# Patient Record
Sex: Male | Born: 1990 | Race: White | Hispanic: No | Marital: Single | Smoking: Never smoker
Health system: Southern US, Community
[De-identification: ages and names within clinical notes are randomized; demographics above are authoritative.]

## PROBLEM LIST (undated history)

## (undated) DIAGNOSIS — G43909 Migraine, unspecified, not intractable, without status migrainosus: Secondary | ICD-10-CM

## (undated) HISTORY — PX: APPENDECTOMY: SHX54

---
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
## 2012-08-27 ENCOUNTER — Encounter (HOSPITAL_COMMUNITY): Payer: Self-pay | Admitting: Emergency Medicine

## 2012-08-27 ENCOUNTER — Emergency Department (HOSPITAL_COMMUNITY)
Admission: EM | Admit: 2012-08-27 | Discharge: 2012-08-27 | Disposition: A | Discharge status: Home / Self Care | Payer: BC Managed Care – PPO | Attending: Emergency Medicine | Admitting: Emergency Medicine

## 2012-08-27 DIAGNOSIS — J029 Acute pharyngitis, unspecified: Secondary | ICD-10-CM | POA: Insufficient documentation

## 2012-08-27 MED ORDER — PENICILLIN V POTASSIUM 250 MG PO TABS
500.0000 mg | ORAL_TABLET | Freq: Once | ORAL | Status: AC
  Administered 2012-08-27: 500 mg via ORAL
  Filled 2012-08-27: qty 2

## 2012-08-27 MED ORDER — PENICILLIN V POTASSIUM 500 MG PO TABS: 500.0000 mg | ORAL_TABLET | Freq: Four times a day (QID) | ORAL | Status: AC

## 2012-08-27 NOTE — ED Notes (Signed)
Pt c/o sore throat since yesterday 

## 2012-08-27 NOTE — ED Provider Notes (Signed)
History     CSN: [id number]  Arrival date & time 08/27/12  1019   First MD Initiated Contact with Patient 08/27/12 1043      Chief Complaint  Patient presents with  . Sore Throat    (Consider location/radiation/quality/duration/timing/severity/associated sxs/prior treatment) HPI Comments: Sore throat for several days.  No other complaints   Patient is a 21 y.o. male presenting with pharyngitis. The history is provided by the patient. No language interpreter was used.  Sore Throat This is a new problem. Episode onset: several days ago. The problem occurs constantly. The problem has been unchanged. Associated symptoms include myalgias and a sore throat. Pertinent negatives include no chills, coughing, fever, headaches, nausea, rash or vomiting. The symptoms are aggravated by swallowing. Treatments tried: chloraseptic. The treatment provided no relief.    History reviewed. No pertinent past medical history.  Past Surgical History  Procedure Date  . Appendectomy     No family history on file.  History  Substance Use Topics  . Smoking status: Not on file  . Smokeless tobacco: Not on file  . Alcohol Use: No      Review of Systems  Constitutional: Negative for fever and chills.  HENT: Positive for sore throat. Negative for drooling and trouble swallowing.   Respiratory: Negative for cough.   Gastrointestinal: Negative for nausea, vomiting and diarrhea.  Musculoskeletal: Positive for myalgias.  Skin: Negative for rash.  Neurological: Negative for headaches.  All other systems reviewed and are negative.    Allergies  Review of patient's allergies indicates no known allergies.  Home Medications   Current Outpatient Rx  Name Route Sig Dispense Refill  . PHENOL 1.4 % MT LIQD Mouth/Throat Use as directed 1 spray in the mouth or throat as needed. For sore throat    . PENICILLIN V POTASSIUM 500 MG PO TABS Oral Take 1 tablet (500 mg total) by mouth 4 (four) times daily.  40 tablet 0    BP 143/66  Pulse 90  Temp 99.4 F (37.4 C) (Oral)  Resp 18  Ht 6\' 2"  (1.88 m)  Wt 220 lb (99.791 kg)  BMI 28.25 kg/m2  SpO2 98%  Physical Exam  Nursing note and vitals reviewed. Constitutional: He is oriented to person, place, and time. He appears well-developed and well-nourished.  HENT:  Head: Normocephalic and atraumatic.  Mouth/Throat: Uvula is midline and mucous membranes are normal. Normal dentition. No dental abscesses, uvula swelling or dental caries. Oropharyngeal exudate and posterior oropharyngeal erythema present. No posterior oropharyngeal edema or tonsillar abscesses.  Eyes: EOM are normal.  Neck: Normal range of motion.  Cardiovascular: Normal rate, regular rhythm, normal heart sounds and intact distal pulses.   Pulmonary/Chest: Effort normal and breath sounds normal. No respiratory distress.  Abdominal: Soft. He exhibits no distension. There is no tenderness.  Musculoskeletal: Normal range of motion.  Lymphadenopathy:    He has cervical adenopathy.       Right cervical: Superficial cervical and deep cervical adenopathy present. No posterior cervical adenopathy present.      Left cervical: Superficial cervical and deep cervical adenopathy present. No posterior cervical adenopathy present.  Neurological: He is alert and oriented to person, place, and time.  Skin: Skin is warm and dry.  Psychiatric: He has a normal mood and affect. Judgment normal.    ED Course  Procedures (including critical care time)   Labs Reviewed  RAPID STREP SCREEN   No results found.   1. Pharyngitis  MDM  empiric tx of strep rx-pen VK 500 mg QID x 10 days. F/u with PCP          Evalina Field, PA 08/27/12 1124

## 2012-08-27 NOTE — ED Provider Notes (Signed)
Medical screening examination/treatment/procedure(s) were performed by non-physician practitioner and as supervising physician I was immediately available for consultation/collaboration.   Dione Booze, MD 08/27/12 [phone]

## 2013-05-07 ENCOUNTER — Encounter (HOSPITAL_COMMUNITY): Payer: Self-pay | Admitting: *Deleted

## 2013-05-07 ENCOUNTER — Emergency Department (HOSPITAL_COMMUNITY)
Admission: EM | Admit: 2013-05-07 | Discharge: 2013-05-07 | Disposition: A | Discharge status: Home / Self Care | Payer: Self-pay | Attending: Emergency Medicine | Admitting: Emergency Medicine

## 2013-05-07 ENCOUNTER — Emergency Department (HOSPITAL_COMMUNITY): Payer: Self-pay

## 2013-05-07 DIAGNOSIS — F40243 Fear of flying: Secondary | ICD-10-CM

## 2013-05-07 DIAGNOSIS — F4000 Agoraphobia, unspecified: Secondary | ICD-10-CM | POA: Insufficient documentation

## 2013-05-07 DIAGNOSIS — R51 Headache: Secondary | ICD-10-CM | POA: Insufficient documentation

## 2013-05-07 DIAGNOSIS — H53149 Visual discomfort, unspecified: Secondary | ICD-10-CM | POA: Insufficient documentation

## 2013-05-07 MED ORDER — HYDROCODONE-ACETAMINOPHEN 5-325 MG PO TABS: 1.0000 | ORAL_TABLET | ORAL | Status: DC | PRN

## 2013-05-07 MED ORDER — PREDNISONE 50 MG PO TABS
60.0000 mg | ORAL_TABLET | Freq: Once | ORAL | Status: AC
  Administered 2013-05-07: 60 mg via ORAL
  Filled 2013-05-07: qty 1

## 2013-05-07 MED ORDER — HYDROCODONE-ACETAMINOPHEN 5-325 MG PO TABS
1.0000 | ORAL_TABLET | Freq: Once | ORAL | Status: AC
  Administered 2013-05-07: 1 via ORAL
  Filled 2013-05-07: qty 1

## 2013-05-07 MED ORDER — ALPRAZOLAM 0.5 MG PO TABS: 0.5000 mg | ORAL_TABLET | Freq: Every evening | ORAL | Status: DC | PRN

## 2013-05-07 MED ORDER — DIPHENHYDRAMINE HCL 25 MG PO CAPS
50.0000 mg | ORAL_CAPSULE | Freq: Once | ORAL | Status: AC
  Administered 2013-05-07: 50 mg via ORAL
  Filled 2013-05-07: qty 2

## 2013-05-07 MED ORDER — METOCLOPRAMIDE HCL 10 MG PO TABS
10.0000 mg | ORAL_TABLET | Freq: Once | ORAL | Status: AC
  Administered 2013-05-07: 10 mg via ORAL
  Filled 2013-05-07: qty 1

## 2013-05-07 NOTE — ED Notes (Signed)
Patient with no complaints at this time. Respirations even and unlabored. Skin warm/dry. Discharge instructions reviewed with patient at this time. Patient given opportunity to voice concerns/ask questions. Patient discharged at this time and left Emergency Department with steady gait.   

## 2013-05-07 NOTE — ED Notes (Signed)
After discharging patient and cleaning room, Reglan and Prednisone tablets were found in bed from earlier administration.  At discharge patient was asking if provider would consider changing Vicodin rx to Percocet "since that's what my grandma gave me that worked."

## 2013-05-07 NOTE — ED Notes (Signed)
Frequent HA over last 2 weeks.  At max pain is 10/10 and lasts 2-3 hours.  Takes Tylenol w/minimal relief.  No past hx of HA.  Recently under stress about having to fly to [employer] [employer] to look at colleges to transfer to.  Missed one scheduled visit last week d/t anxiety about flying.

## 2013-05-07 NOTE — ED Provider Notes (Signed)
History     CSN: [id number]  Arrival date & time 05/07/13  0805   First MD Initiated Contact with Patient 05/07/13 0815      Chief Complaint  Patient presents with  . Migraine    (Consider location/radiation/quality/duration/timing/severity/associated sxs/prior treatment) HPI Comments: Nicholas [name] is a 22 y.o. Male presenting with a 2 week history of intermittent headache.  He denies prior history of headaches. 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  He has had 6 distinct gradually occuring headaches over the past 2 weeks which is accompanied by photophobia, but no other visual changes, nausea, vomiting,  Dizziness, focal weakness,  But does describe feelings of decreased coordination when walking during the episodes,  Which usually last about 3 hours on average, which has been treated with tylenol.  He has experienced a transient nose bleed when the headache ends which stops with gentle pressure to the nose.  He denies a history of hypertension, has had no fevers, but reports keeps constant nasal congestion due to allergies. His nasal secretions have been clear.    As an aside, he also volunteers that he was supposed to get on a plane last week to visit schools in [place] and backed out just before getting on the plane due to extreme fear of flying, and wonders if his headaches could be due to the stress of that event.     The history is provided by the patient.    History reviewed. No pertinent past medical history.  Past Surgical History  Procedure Laterality Date  . Appendectomy      No family history on file.  History  Substance Use Topics  . Smoking status: Never Smoker   . Smokeless tobacco: Not on file  . Alcohol Use: No      Review of Systems  Constitutional: Negative for fever.  HENT: Negative for congestion, sore throat and neck pain.   Eyes: Negative.   Respiratory: Negative for chest  tightness and shortness of breath.   Cardiovascular: Negative for chest pain.  Gastrointestinal: Negative for nausea and abdominal pain.  Genitourinary: Negative.   Musculoskeletal: Negative for joint swelling and arthralgias.  Skin: Negative.  Negative for rash and wound.  Neurological: Negative for dizziness, weakness, light-headedness, numbness and headaches.  Psychiatric/Behavioral: Negative.     Allergies  Demerol and Ibuprofen  Home Medications   Current Outpatient Rx  Name  Route  Sig  Dispense  Refill  . acetaminophen (TYLENOL) 500 MG tablet   Oral   Take 1,000-1,500 mg by mouth 5 (five) times daily as needed for pain.         Marland Kitchen ALPRAZolam (XANAX) 0.5 MG tablet   Oral   Take 1 tablet (0.5 mg total) by mouth at bedtime as needed for anxiety.   6 tablet   0   . HYDROcodone-acetaminophen (NORCO/VICODIN) 5-325 MG per tablet   Oral   Take 1 tablet by mouth every 4 (four) hours as needed for pain.   10 tablet   0     BP 142/72  Pulse 65  Temp(Src) 97.5 F (36.4 C) (Oral)  Resp 18  Ht 6\' 2"  (1.88 m)  Wt 218 lb (98.884 kg)  BMI 27.98 kg/m2  SpO2 99%  Physical Exam  Nursing note and vitals reviewed. Constitutional: He is oriented to person, place, and time. He appears well-developed and well-nourished.  Uncomfortable appearing  HENT:  Head: Normocephalic and atraumatic.  Nose: No epistaxis.  Mouth/Throat: Oropharynx is clear and moist.  Dried scab left nostril septum from previous nosebleed.  Eyes: EOM are normal. Pupils are equal, round, and reactive to light.  Neck: Normal range of motion. Neck supple.  Cardiovascular: Normal rate and normal heart sounds.   Pulmonary/Chest: Effort normal.  Abdominal: Soft. There is no tenderness.  Musculoskeletal: Normal range of motion.  Lymphadenopathy:    He has no cervical adenopathy.  Neurological: He is alert and oriented to person, place, and time. He has normal strength. No sensory deficit. Gait normal. GCS eye  subscore is 4. GCS verbal subscore is 5. GCS motor subscore is 6.  Normal heel-shin, normal rapid alternating movements. Cranial nerves III-XII intact.  No pronator drift.  Skin: Skin is warm and dry. No rash noted.  Psychiatric: He has a normal mood and affect. His speech is normal and behavior is normal. Thought content normal. Cognition and memory are normal.    ED Course  Procedures (including critical care time)  Labs Reviewed - No data to display Ct Head Wo Contrast  05/07/2013   *RADIOLOGY REPORT*  Clinical Data: Severe migraine headache.  CT HEAD WITHOUT CONTRAST  Technique:  Contiguous axial images were obtained from the base of the skull through the vertex without contrast.  Comparison: None.  Findings: The brain demonstrates no evidence of hemorrhage, infarction, edema, mass effect, extra-axial fluid collection, hydrocephalus or mass lesion.  The skull is unremarkable. There is mild mucosal thickening in bilateral ethmoid air cells.  IMPRESSION: Normal head CT.   Original Report Authenticated By: Irish Lack, M.D.     1. Headache   2. Phobia, flying     Pt was given prednisone, benadryl and reglan PO (refused needles) and reported no headache relief.  He was given a dose of hydrocodone also with no relief of headache.  Offered to start IV line so can give stronger pain medication - pt deferred, stating he would rather go home , take the prescription and rest.    After pt dc'd home,  RN discovered he had stashed the prednisone and reglan tabs,  Only apparently took the benadryl when given these meds in the ed.  MDM  Nonspecific headache with normal exam and Ct head.  He was prescribed hydrocodone for pain relief. Also prescribed xanax 0.5 mg #6 to take before flying.  Referrals given to establish pcp.    No neuro deficit on exam or by history to suggest emergent or surgical presentation.  Also discussed worsened sx that should prompt immediate re-evaluation.  Suspect headache  is stress induced.          Burgess Amor, PA-C 05/07/13 1753

## 2013-05-07 NOTE — Discharge Instructions (Signed)
General Headache Without Cause A general headache is pain or discomfort felt around the head or neck area. The cause may not be found.  HOME CARE   Keep all doctor visits.  Only take medicines as told by your doctor.  Lie down in a dark, quiet room when you have a headache.  Keep a journal to find out if certain things bring on headaches. For example, write down:  What you eat and drink.  How much sleep you get.  Any change to your diet or medicines.  Relax by getting a massage or doing other relaxing activities.  Put ice or heat packs on the head and neck area as told by your doctor.  Lessen stress.  Sit up straight. Do not tighten (tense) your muscles.  Quit smoking if you smoke.  Lessen how much alcohol you drink.  Lessen how much caffeine you drink, or stop drinking caffeine.  Eat and sleep on a regular schedule.  Get 7 to 9 hours of sleep, or as told by your doctor.  Keep lights dim if bright lights bother you or make your headaches worse. GET HELP RIGHT AWAY IF:   Your headache becomes really bad.  You have a fever.  You have a stiff neck.  You have trouble seeing.  Your muscles are weak, or you lose muscle control.  You lose your balance or have trouble walking.  You feel like you will pass out (faint), or you pass out.  You have really bad symptoms that are different than your first symptoms.  You have problems with the medicines given to you by your doctor.  Your medicines do not work.  Your headache feels different than the other headaches.  You feel sick to your stomach (nauseous) or throw up (vomit). MAKE SURE YOU:   Understand these instructions.  Will watch your condition.  Will get help right away if you are not doing well or get worse. Document Released: 08/12/2008 Document Revised: 01/26/2012 Document Reviewed: 10/24/2011 [facility] Patient Information 2014 [place], [place].   RESOURCE GUIDE  Chronic Pain Problems: Contact  [facility]  [phone] Patients need to be referred by their primary care doctor.  Insufficient Money for Medicine: Contact United Way:  call "211."   No Primary Care Doctor: - Call Health Connect  [phone] - can help you locate a primary care doctor that  accepts your insurance, provides certain services, etc. - Physician Referral Service- [phone]  Agencies that provide inexpensive medical care: - [facility]  [phone] - [facility]  [phone] - [facility]  [phone] - Women's Clinic  [phone] - Planned Parenthood  [phone] [facility]  [phone]  Medicaid-accepting [facility] Providers: - [facility]- [address]  [phone], Mon-Fri 9am-7pm, Sat 9am-1pm - [facility]- [address] [place], Suite [place]  [phone] - [facility]- [address] [place]  [phone] [facility]- [address]  [phone] - Renaye Rakers- [address] [place], [address], [phone]  Only accepts [place] patients after they have their name  applied to their card  Self Pay (no insurance) in [place]: - Sickle Cell Patients - [facility]  [address] [place], [phone] - [facility]- [address] [place]  [phone]       [facility]  [place]- [address]       -     [place] [employer]- see information above (Speak to [employer] if you do not have insurance)       -  [employer]- [address],  [phone]       -  [facility]- [address] [phone]       -  Dr Julio Sicks-  [address], [place], [phone]       -  Urgent Medical and [facility] - [address], [phone]       -  [facility]- [address] [phone], also [address], [phone]       -     [facility]- [address] [place], [phone], 1st & 3rd Saturday         every month, 10am-1pm  -     [facility]   [address] [place], [place].   Phone:  [phone], Fax:  [phone]. Hours of Operation:  9 am - 6 pm, M-F.  -     [facility] for Children   [address], [place]   Phone: [phone], Fax: [phone]. Hours of Operation:  8:30 am - 5:30 pm, M-F.  [facility] [address] [place], [place] [address] [phone]  [facility] [address] [place], [place] [address] [phone]  1) Find a Doctor and Pay Out of Pocket Although you won't have to find out who is covered by your insurance plan, it is a good idea to ask around and get recommendations. You will then need to call the office and see if the doctor you have chosen will accept you as a new patient and what types of options they offer for patients who are self-pay. Some doctors offer discounts or will set up payment plans for their patients who do not have insurance, but you will need to ask so you aren't surprised when you get to your appointment.  2) Contact Your Local Health Department Not all health departments have doctors that can see patients for sick visits, but many do, so it is worth a call to see if yours does. If you don't know where your local health department is, you can check in your phone book. The CDC also has a tool to help you locate your state's health department, and many state websites also have listings of all of their local health departments.  3) Find a Walk-in Clinic If your illness is not likely to be very severe or complicated, you may want to try a walk in clinic. These are popping up all over the country in pharmacies, drugstores, and shopping centers. They're usually staffed by nurse practitioners or physician assistants that have been trained to treat common illnesses and complaints. They're usually  fairly quick and inexpensive. However, if you have serious medical issues or chronic medical problems, these are probably not your best option  STD Testing - [facility] Department of [facility], STD Clinic, [address] [place], phone [phone] or [phone].  Monday - Friday, call for an appointment. [facility] Department of [employer], STD Clinic, [address], [place], phone [phone] or [phone].  Monday - Friday, call for an appointment.  Abuse/Neglect: [facility] Child Abuse Hotline (647) 340-8116 -  [facility] Child Abuse Hotline (762)436-3452 (After Hours)  Emergency Shelter:  [facility] 806-435-8291  Maternity Homes: - Room at the [facility] [phone] - [facility] ([phone])[phone]  MRSA Hotline #:   405-679-3008  Dental Assistance If unable to pay or uninsured, contact:  [facility]. to become qualified for the adult dental clinic.  Patients with Medicaid: [facility] [phone] [address], [phone] [address] [phone]  If unable to pay, or uninsured, contact [facility] [phone] in [place], [phone] in [facility]) to become qualified for the adult dental clinic  [facility] [address] [place], [place] [address] [phone] [email or web address]  Other Proofreader Services: - [facility]- [address] [place], [place], [place], [address], [phone], Ext. 123, 2nd and 4th Thursday of the month at 6:30am.  10 clients each day by appointment, can sometimes see walk-in patients if someone does not show for an appointment. [facility]- [address] [place], [place], [address], [phone] - [facility] [address] [place], [place], [address], [phone] - [facility]- [phone] [facility] Health  Department- [phone] [facility] Health [phone]       Behavioral Health Resources in the [facility]  Intensive Outpatient Programs: [facility]      [address] [place], [place] [phone] Both a day and evening program       [facility] Outpatient     [address]        [place], [place] [address] [phone]         ADS: Alcohol & Drug Svcs [address] [place] [phone]  [facility] Mental Health ACCESS LINE: (763)774-6874 or [phone] [address] [place], [place] [address] [email or web address]   Substance Abuse Resources: - Alcohol and Drug Services  856-832-7856 - [facility] [phone] - [facility] [phone] Floydene Flock [phone] - Residential & Outpatient Substance Abuse Program  [phone]  Psychological Services: [facility]  [phone] [facility] Services  [phone] - [facility], [phone] [place]. [address] [place], ACCESS LINE: (913)830-6241 or 514-493-8399, [email or web address]  Mobile Crisis Teams:                                        Therapeutic Alternatives         Mobile Crisis Care Unit [phone]             [facility] [address] [place] [phone]                                         Interventionist [address] [place] [phone]  Self-Help/Support Groups: Mental Health Assoc. of [employer] of support groups (984)838-1576 (call for more info)  Narcotics Anonymous (NA) Caring [facility] [address] [place] - 2 meetings at this location  Residential Treatment Programs:  [facility]      [address]        [place]       [phone]         [facility] [address] [place], [place]   [address] [phone]  [facility]  [address] [place], [place] [address] [phone] Admissions: 8am-3pm M-F  [facility]     [address]        [place], [place] [address]       [phone]         [facility] [address] [place], [place] [phone]  [facility] [address] [place], [place] [phone]  [facility] - Intensive Outpatient      [address]     [place], [place]       [phone]         [facility] ([facility]     [address] [place], [place] [phone] or [phone]  Residential Treatment Services (RTS), Medicaid [address] [place], [place] [phone]  [facility] [address]                                               [address] [place] [phone]  [facility] Resources: [phone]               General Therapy                                                Angie Fava, PhD        [address] [place], [place] [address]         [phone]   Insurance  [facility]al   [address] [place], [place] [address] [phone]  [facility] [address] [place], [place] [address] [phone] Insurance/Medicaid/sponsorship through [facility]                                              [address]                                        [place], [place] [address]    Therapy/tele-psych/case         [phone]          [facility] [address], [place]  [address]  Adolescent/group home/case management [phone]                                           Creola Corn PhD       General therapy       Insurance   [phone]         Dr. Lolly Mustache, [place], M-F [phone]  [facility]  United [employer] [facility]  Dept. [address]                 [address]         [address] [place] [address]  Blondell Reveal Phone:  [phone]                                  Phone:  [phone]                   Phone:  [phone]  [facility], [phone] - [facility] - [facility]- [phone]       -     [facility] in [place], [address]             [phone], Insurance  [place] Child Abuse Hotline 907 242 2114 or 4317559446 (After Hours)    You may take the medicine prescribed if you continue to have headaches.  Your Ct scan and your exam are normal today.  Do not drive within 4 hours of taking this pain medicine as it will make you sleepy.  I am prescribing you a medicine to help you with your fear of flying - take one tablet before you get on the plane - this will help you relax, and will also make you sleepy,  So use caution - do not take this at the same time of the pain medicine prescribed.

## 2013-05-07 NOTE — ED Notes (Signed)
Migraine headache that started this morning with light sensitivity.  Denies n/v/visual disturbances.  States this is 3rd migraine this week and reports nose bleeds after pain goes away.

## 2013-05-08 NOTE — ED Provider Notes (Signed)
Medical screening examination/treatment/procedure(s) were performed by non-physician practitioner and as supervising physician I was immediately available for consultation/collaboration.  Lyanne Co, MD 05/08/13 [phone]

## 2013-07-17 ENCOUNTER — Encounter (HOSPITAL_COMMUNITY): Payer: Self-pay | Admitting: *Deleted

## 2013-07-17 ENCOUNTER — Emergency Department (HOSPITAL_COMMUNITY)
Admission: EM | Admit: 2013-07-17 | Discharge: 2013-07-17 | Disposition: A | Discharge status: Home / Self Care | Payer: Self-pay | Attending: Emergency Medicine | Admitting: Emergency Medicine

## 2013-07-17 DIAGNOSIS — H53149 Visual discomfort, unspecified: Secondary | ICD-10-CM | POA: Insufficient documentation

## 2013-07-17 DIAGNOSIS — H538 Other visual disturbances: Secondary | ICD-10-CM | POA: Insufficient documentation

## 2013-07-17 DIAGNOSIS — R51 Headache: Secondary | ICD-10-CM | POA: Insufficient documentation

## 2013-07-17 HISTORY — DX: Migraine, unspecified, not intractable, without status migrainosus: G43.909

## 2013-07-17 MED ORDER — DIPHENHYDRAMINE HCL 25 MG PO CAPS
50.0000 mg | ORAL_CAPSULE | Freq: Once | ORAL | Status: AC
  Administered 2013-07-17: 50 mg via ORAL
  Filled 2013-07-17: qty 2

## 2013-07-17 MED ORDER — PREDNISONE 50 MG PO TABS
60.0000 mg | ORAL_TABLET | Freq: Once | ORAL | Status: AC
  Administered 2013-07-17: 60 mg via ORAL
  Filled 2013-07-17: qty 1

## 2013-07-17 MED ORDER — METOCLOPRAMIDE HCL 10 MG PO TABS
10.0000 mg | ORAL_TABLET | Freq: Once | ORAL | Status: AC
  Administered 2013-07-17: 10 mg via ORAL
  Filled 2013-07-17: qty 1

## 2013-07-17 MED ORDER — BUTALBITAL-APAP-CAFFEINE 50-325-40 MG PO TABS: 1.0000 | ORAL_TABLET | ORAL | Status: DC | PRN

## 2013-07-17 NOTE — ED Provider Notes (Signed)
CSN: [id number]     Arrival date & time 07/17/13  1444 History   First MD Initiated Contact with Patient 07/17/13 1516     Chief Complaint  Patient presents with  . Migraine   (Consider location/radiation/quality/duration/timing/severity/associated sxs/prior Treatment) Patient is a 22 y.o. male presenting with headaches. The history is provided by the patient.  Headache Pain location:  R temporal, L temporal and frontal Quality:  Dull Radiates to:  Does not radiate Severity currently:  9/10 Onset quality:  Gradual Timing:  Constant Progression:  Unchanged Chronicity:  Recurrent Similar to prior headaches: yes   Context: not activity, not exposure to bright light, not coughing and not eating   Relieved by: patient states headache always improve with hydrocodone. Worsened by:  Nothing tried Ineffective treatments:  None tried Associated symptoms: blurred vision and photophobia   Associated symptoms: no abdominal pain, no back pain, no congestion, no dizziness, no pain, no facial pain, no fatigue, no fever, no hearing loss, no loss of balance, no nausea, no near-syncope, no neck pain, no neck stiffness, no numbness, no paresthesias, no seizures, no sinus pressure, no sore throat, no swollen glands, no syncope, no tingling, no URI, no visual change, no vomiting and no weakness   Associated symptoms comment:  Brief episode of left sided epistaxis at headache onset.  Same as previous.    Past Medical History  Diagnosis Date  . Migraine    Past Surgical History  Procedure Laterality Date  . Appendectomy     No family history on file. History  Substance Use Topics  . Smoking status: Never Smoker   . Smokeless tobacco: Not on file  . Alcohol Use: No    Review of Systems  Constitutional: Negative for fever, activity change, appetite change and fatigue.  HENT: Negative for hearing loss, congestion, sore throat, facial swelling, trouble swallowing, neck pain, neck stiffness and  sinus pressure.   Eyes: Positive for blurred vision and photophobia. Negative for pain and visual disturbance.  Respiratory: Negative for shortness of breath.   Cardiovascular: Negative for chest pain, syncope and near-syncope.  Gastrointestinal: Negative for nausea, vomiting and abdominal pain.  Musculoskeletal: Negative for back pain.  Skin: Negative for rash and wound.  Neurological: Positive for headaches. Negative for dizziness, seizures, facial asymmetry, speech difficulty, weakness, light-headedness, numbness, paresthesias and loss of balance.  Psychiatric/Behavioral: Negative for confusion and decreased concentration.  All other systems reviewed and are negative.    Allergies  Ibuprofen and Tramadol  Home Medications   Current Outpatient Rx  Name  Route  Sig  Dispense  Refill  . acetaminophen (TYLENOL) 500 MG tablet   Oral   Take 1,000-1,500 mg by mouth 5 (five) times daily as needed for pain.          BP 140/74  Pulse 77  Temp(Src) 98.3 F (36.8 C) (Oral)  Resp 18  Ht 6\' 2"  (1.88 m)  Wt 210 lb (95.255 kg)  BMI 26.95 kg/m2  SpO2 100% Physical Exam  Nursing note and vitals reviewed. Constitutional: He is oriented to person, place, and time. He appears well-developed and well-nourished. No distress.  HENT:  Head: Normocephalic and atraumatic.  Right Ear: Tympanic membrane and ear canal normal.  Left Ear: Tympanic membrane and ear canal normal.  Nose: Nose normal. No nasal deformity. No epistaxis.  Mouth/Throat: Oropharynx is clear and moist.  No epistaxis  Eyes: EOM are normal. Pupils are equal, round, and reactive to light.  Neck: Normal range of motion, full  passive range of motion without pain and phonation normal. Neck supple. No rigidity. Normal range of motion present. No Brudzinski's sign and no Kernig's sign noted.  Cardiovascular: Normal rate, regular rhythm, normal heart sounds and intact distal pulses.   No murmur heard. Pulmonary/Chest: Effort  normal and breath sounds normal. No respiratory distress.  Musculoskeletal: Normal range of motion.  Neurological: He is alert and oriented to person, place, and time. He has normal strength. No cranial nerve deficit or sensory deficit. He exhibits normal muscle tone. Coordination and gait normal. GCS eye subscore is 4. GCS verbal subscore is 5. GCS motor subscore is 6.  Reflex Scores:      Tricep reflexes are 2+ on the right side and 2+ on the left side.      Bicep reflexes are 2+ on the right side and 2+ on the left side. Skin: Skin is warm and dry.  Psychiatric: He has a normal mood and affect.    ED Course  Procedures (including critical care time) Labs Review Labs Reviewed - No data to display Imaging Review No results found.  MDM    [id number]  Patient is feeling better.  Headache improved.  No focal neuro deficits or meningeal signs vital signs are stable. Patient ambulates with a steady gait. He was seen here on 05/07/2013 for the same states headache today is similar to previous, epistaxis also similar to previous.  CT of the head and from June was normal.  No clinical indication for LP or repeat CT scan today.  Discussed with the patient the importance of close followup for his recurrent migraine headaches. I will give him referral for neurology and resource guide was also dispensed.  VSS  Patient stable for discharge.  Dayna Alia L. Trisha Mangle, PA-C 07/17/13 2136

## 2013-07-17 NOTE — ED Notes (Addendum)
Pt c/o migraine headache with nose bleed that started this am, denies any n/v, states that he has had problems with migraines in the past, pt states that this migraine and nose bleed is the same as what he has had in the past. States that he was seen in er over the summer for migraine and seen at [facility] er for migraines, given referral list from both er's but has  Not been able to find a pcp that is taking new pts.

## 2013-07-18 NOTE — ED Provider Notes (Signed)
Medical screening examination/treatment/procedure(s) were performed by non-physician practitioner and as supervising physician I was immediately available for consultation/collaboration. Salayah Meares, MD, FACEP   Cobie Marcoux L Shamela Haydon, MD 07/18/13 0023 

## 2014-03-16 ENCOUNTER — Emergency Department (HOSPITAL_COMMUNITY)
Admission: EM | Admit: 2014-03-16 | Discharge: 2014-03-17 | Disposition: A | Discharge status: Other Acute Inpatient Hospital | Payer: 59 | Attending: Emergency Medicine | Admitting: Emergency Medicine

## 2014-03-16 ENCOUNTER — Encounter (HOSPITAL_COMMUNITY): Payer: Self-pay | Admitting: Emergency Medicine

## 2014-03-16 DIAGNOSIS — F1320 Sedative, hypnotic or anxiolytic dependence, uncomplicated: Secondary | ICD-10-CM | POA: Insufficient documentation

## 2014-03-16 DIAGNOSIS — F1120 Opioid dependence, uncomplicated: Secondary | ICD-10-CM | POA: Diagnosis present

## 2014-03-16 DIAGNOSIS — G43909 Migraine, unspecified, not intractable, without status migrainosus: Secondary | ICD-10-CM | POA: Insufficient documentation

## 2014-03-16 DIAGNOSIS — Z79899 Other long term (current) drug therapy: Secondary | ICD-10-CM | POA: Insufficient documentation

## 2014-03-16 LAB — COMPREHENSIVE METABOLIC PANEL
ALT: 21 U/L (ref 0–53)
AST: 17 U/L (ref 0–37)
Albumin: 4.4 g/dL (ref 3.5–5.2)
Alkaline Phosphatase: 83 U/L (ref 39–117)
BILIRUBIN TOTAL: 0.3 mg/dL (ref 0.3–1.2)
BUN: 9 mg/dL (ref 6–23)
CALCIUM: 10.2 mg/dL (ref 8.4–10.5)
CHLORIDE: 100 meq/L (ref 96–112)
CO2: 27 meq/L (ref 19–32)
CREATININE: 0.75 mg/dL (ref 0.50–1.35)
GLUCOSE: 128 mg/dL — AB (ref 70–99)
Potassium: 3.7 mEq/L (ref 3.7–5.3)
Sodium: 142 mEq/L (ref 137–147)
Total Protein: 7.8 g/dL (ref 6.0–8.3)

## 2014-03-16 LAB — CBC
HEMATOCRIT: 39.6 % (ref 39.0–52.0)
HEMOGLOBIN: 13.6 g/dL (ref 13.0–17.0)
MCH: 31.8 pg (ref 26.0–34.0)
MCHC: 34.3 g/dL (ref 30.0–36.0)
MCV: 92.5 fL (ref 78.0–100.0)
Platelets: 260 10*3/uL (ref 150–400)
RBC: 4.28 MIL/uL (ref 4.22–5.81)
RDW: 12.7 % (ref 11.5–15.5)
WBC: 18.2 10*3/uL — ABNORMAL HIGH (ref 4.0–10.5)

## 2014-03-16 LAB — RAPID URINE DRUG SCREEN, HOSP PERFORMED
Amphetamines: NOT DETECTED
Barbiturates: NOT DETECTED
Benzodiazepines: POSITIVE — AB
COCAINE: NOT DETECTED
OPIATES: POSITIVE — AB
Tetrahydrocannabinol: POSITIVE — AB

## 2014-03-16 LAB — ETHANOL: Alcohol, Ethyl (B): <11 mg/dL (ref 0–11)

## 2014-03-16 MED ORDER — LOPERAMIDE HCL 2 MG PO CAPS: 2.0000 mg | ORAL_CAPSULE | ORAL | Status: DC | PRN

## 2014-03-16 MED ORDER — CHLORDIAZEPOXIDE HCL 25 MG PO CAPS: 25.0000 mg | ORAL_CAPSULE | ORAL | Status: DC

## 2014-03-16 MED ORDER — CLONIDINE HCL 0.1 MG PO TABS
0.1000 mg | ORAL_TABLET | Freq: Four times a day (QID) | ORAL | Status: DC
  Administered 2014-03-17 (×2): 0.1 mg via ORAL
  Filled 2014-03-16 (×3): qty 1

## 2014-03-16 MED ORDER — CHLORDIAZEPOXIDE HCL 25 MG PO CAPS
25.0000 mg | ORAL_CAPSULE | Freq: Every day | ORAL | Status: DC
Start: 2014-03-20 — End: 2014-03-17

## 2014-03-16 MED ORDER — CHLORDIAZEPOXIDE HCL 25 MG PO CAPS: 25.0000 mg | ORAL_CAPSULE | Freq: Three times a day (TID) | ORAL | Status: DC

## 2014-03-16 MED ORDER — CLONIDINE HCL 0.1 MG PO TABS: 0.1000 mg | ORAL_TABLET | Freq: Every day | ORAL | Status: DC

## 2014-03-16 MED ORDER — NAPROXEN 500 MG PO TABS
500.0000 mg | ORAL_TABLET | Freq: Two times a day (BID) | ORAL | Status: DC | PRN
  Administered 2014-03-16 – 2014-03-17 (×2): 500 mg via ORAL
  Filled 2014-03-16 (×3): qty 1

## 2014-03-16 MED ORDER — CHLORDIAZEPOXIDE HCL 25 MG PO CAPS
50.0000 mg | ORAL_CAPSULE | Freq: Once | ORAL | Status: AC
  Administered 2014-03-16: 50 mg via ORAL
  Filled 2014-03-16: qty 2

## 2014-03-16 MED ORDER — HYDROXYZINE HCL 25 MG PO TABS
25.0000 mg | ORAL_TABLET | Freq: Four times a day (QID) | ORAL | Status: DC | PRN
  Administered 2014-03-16 – 2014-03-17 (×2): 25 mg via ORAL
  Filled 2014-03-16 (×2): qty 1

## 2014-03-16 MED ORDER — CHLORDIAZEPOXIDE HCL 25 MG PO CAPS: 25.0000 mg | ORAL_CAPSULE | Freq: Four times a day (QID) | ORAL | Status: DC | PRN

## 2014-03-16 MED ORDER — ONDANSETRON 4 MG PO TBDP: 4.0000 mg | ORAL_TABLET | Freq: Four times a day (QID) | ORAL | Status: DC | PRN

## 2014-03-16 MED ORDER — DICYCLOMINE HCL 20 MG PO TABS
20.0000 mg | ORAL_TABLET | Freq: Four times a day (QID) | ORAL | Status: DC | PRN
  Administered 2014-03-17: 20 mg via ORAL
  Filled 2014-03-16: qty 1

## 2014-03-16 MED ORDER — METHOCARBAMOL 500 MG PO TABS
500.0000 mg | ORAL_TABLET | Freq: Three times a day (TID) | ORAL | Status: DC | PRN
  Administered 2014-03-16: 500 mg via ORAL
  Filled 2014-03-16 (×2): qty 1

## 2014-03-16 MED ORDER — CHLORDIAZEPOXIDE HCL 25 MG PO CAPS
25.0000 mg | ORAL_CAPSULE | Freq: Four times a day (QID) | ORAL | Status: DC
  Administered 2014-03-17 (×3): 25 mg via ORAL
  Filled 2014-03-16 (×3): qty 1

## 2014-03-16 MED ORDER — TRAZODONE HCL 50 MG PO TABS
50.0000 mg | ORAL_TABLET | Freq: Every evening | ORAL | Status: DC | PRN
  Administered 2014-03-16: 50 mg via ORAL
  Filled 2014-03-16: qty 1

## 2014-03-16 MED ORDER — CLONIDINE HCL 0.1 MG PO TABS: 0.1000 mg | ORAL_TABLET | ORAL | Status: DC

## 2014-03-16 NOTE — BH Assessment (Addendum)
Assessment Note  Nicholas [name] is a 23 year old white male requesting detox from opiates.  Patient reports that he has been addicted to opiates for approximately 18 months.  Patient reports his last usage of oxycodone was 30 mg and it was this morning.  Patient reports that he uses 150-180mg  daily.   Patient denies HI; however, when he does not use he "gets violent, tearing up stuff." Additionally, he also reports he wants to hurt himself or hurt others when he does not use opiates.  Patient denies physical, sexual and emotional abuse.  Patient reports that he has also used oxycodone and phenethanol as well as15 mg benzodiazepines, acid, and mushrooms in the past.   Patient denies using opiates in high school. He also denies heroine or crack usage.   Patients UDS was positive for opiates.  He attempted detoxification on his own last year without success.  Patient reports that he experiences a seizure Tuesday of last week when he attempted to detox on his own.  Patient reports that he is experiencing withdrawal symptoms.   Patient denies psychosis.  Patient denies HI/SI.     Axis I: Opiate dependence and Depressive Disorder  Axis II: Deferred Axis III:  Past Medical History  Diagnosis Date  . Migraine    Axis IV: economic problems, educational problems, occupational problems, other psychosocial or environmental problems, problems related to social environment and problems with access to health care services Axis V: 31-40 impairment in reality testing  Past Medical History:  Past Medical History  Diagnosis Date  . Migraine     Past Surgical History  Procedure Laterality Date  . Appendectomy      Family History: No family history on file.  Social History:  reports that he has never smoked. He does not have any smokeless tobacco history on file. He reports that he uses illicit drugs (Marijuana and Cocaine). He reports that he does not drink alcohol.  Additional Social History:      CIWA: CIWA-Ar BP: 145/80 mmHg Pulse Rate: 88 COWS:    Allergies: No Active Allergies  Home Medications:  (Not in a hospital admission)  OB/GYN Status:  No LMP for male patient.  General Assessment Data Location of Assessment: [facility] ED Is this a Tele or Face-to-Face Assessment?: Face-to-Face Is this an Initial Assessment or a Re-assessment for this encounter?: Initial Assessment Living Arrangements: Parent Can pt return to current living arrangement?: Yes Admission Status: Voluntary Is patient capable of signing voluntary admission?: Yes [facility] Referral Source: Self/Family/Friend  Medical Screening Exam [facility] Walk-in ONLY) Medical Exam completed: Yes  [facility] Living Arrangements: Parent Name of Psychiatrist: None Reported Name of Therapist: None Reported  Education Status Is patient currently in school?: No Current Grade: NA Highest grade of school patient has completed: NA Name of school: NA Contact person: NA  Risk to self Suicidal Ideation: No Suicidal Intent: No Is patient at risk for suicide?: No Suicidal Plan?: No Access to Means: No What has been your use of drugs/alcohol within the last 12 months?: Opiates Previous Attempts/Gestures: No How many times?: 0 Other Self Harm Risks: NA Triggers for Past Attempts:  (NA) Intentional Self Injurious Behavior: None Family Suicide History: No Recent stressful life event(s): Conflict (Comment);Job Loss;Financial Problems Persecutory voices/beliefs?: No Depression: Yes Depression Symptoms: Tearfulness;Guilt;Loss of interest in usual pleasures;Feeling worthless/self pity;Insomnia;Fatigue Substance abuse history and/or treatment for substance abuse?: Yes Suicide prevention information given to non-admitted patients: Not applicable  Risk to Others Homicidal Ideation: No Thoughts of Harm to Others: No Current Homicidal Intent: No Current Homicidal Plan: No Access to  Homicidal Means: No Identified Victim: NA History of harm to others?: No Assessment of Violence: None Noted Violent Behavior Description: NA Does patient have access to weapons?: No Criminal Charges Pending?: No Does patient have a court date: No  Psychosis Hallucinations: None noted Delusions: None noted  Mental Status Report Appear/Hygiene: Disheveled Eye Contact: Fair Motor Activity: Freedom of movement Speech: Logical/coherent Level of Consciousness: Alert Mood: Depressed;Anxious Affect: Anxious Anxiety Level: Minimal Thought Processes: Coherent;Relevant Judgement: Unimpaired Orientation: Person;Place;Time;Situation Obsessive Compulsive Thoughts/Behaviors: None  Cognitive Functioning Concentration: Decreased Memory: Recent Intact;Remote Intact IQ: Average Insight: Fair Impulse Control: Poor Appetite: Fair Weight Loss: 0 Weight Gain: 0 Sleep: Decreased Total Hours of Sleep: 5 Vegetative Symptoms: Decreased grooming  ADLScreening [facility] Assessment Services) Patient's cognitive ability adequate to safely complete daily activities?: Yes Patient able to express need for assistance with ADLs?: Yes Independently performs ADLs?: Yes (appropriate for developmental age)  Prior Inpatient Therapy Prior Inpatient Therapy: No Prior Therapy Dates: NA Prior Therapy Facilty/Provider(s): NA Reason for Treatment: NA  Prior Outpatient Therapy Prior Outpatient Therapy: No Prior Therapy Dates: NA Prior Therapy Facilty/Provider(s): NA Reason for Treatment: NA  ADL Screening (condition at time of admission) Patient's cognitive ability adequate to safely complete daily activities?: Yes Patient able to express need for assistance with ADLs?: Yes Independently performs ADLs?: Yes (appropriate for developmental age)         Values / Beliefs Cultural Requests During Hospitalization: None Spiritual Requests During Hospitalization: None        Additional Information 1:1  In Past 12 Months?: No CIRT Risk: No Elopement Risk: No Does patient have medical clearance?: Yes     Disposition:  Disposition Initial Assessment Completed for this Encounter: Yes Disposition of Patient: Other dispositions Other disposition(s): Other (Comment)  On Site Evaluation by:   Reviewed with Physician:    Clyde CanterburyAva L Blayre Papania 03/16/2014 6:27 PM

## 2014-03-16 NOTE — ED Notes (Signed)
Per pt's family, patients abusing heroin, opiates, acid, THC, xanax-here for detox-has been using today

## 2014-03-16 NOTE — ED Notes (Signed)
Patient reports 5+ xanax per day x last 2 days. C/O mild anxiety presently. [facility]. Continue with current medication orders.

## 2014-03-16 NOTE — ED Provider Notes (Signed)
CSN: [id number]     Arrival date & time 03/16/14  1435 History  This chart was scribed for non-physician practitioner Arthor CaptainAbigail Ronak Duquette, PA-C, working with Toy BakerAnthony T Allen, MD, by Yevette EdwardsAngela Bracken, ED Scribe. This patient was seen in room [facility] and the patient's care was started at 4:46 PM.   First MD Initiated Contact with Patient 03/16/14 1451     Chief Complaint  Patient presents with  . Medical Clearance    The history is provided by the patient. No language interpreter was used.   HPI Comments: Nicholas [name] is a 23 y.o. male who presents to the Emergency Department requesting detoxification. The pt reports he has abused opiates for approximately 18 months; his last usage of oxycodone was 30 mg and it was this morning. The pt states he purchases oxycodone from online. He reports he is "physically dependent" upon opiates and that when he does not use he "gets violent, tearing up stuff." Additionally, he also reports he wants to hurt himself or hurt others when he does not use opiates. He uses oxicodone and phenethanol as well as15 mg benzodiazopenes, acid, and mushrooms.  He denies using opiates in high school. He also denies heroine or crack usage. He denies auditory and visual hallucinations. He attempted detoxification last year without success. The pt reports a h/o withdrawals.  He denies other medical issues. He does not have health insurance.   Past Medical History  Diagnosis Date  . Migraine    Past Surgical History  Procedure Laterality Date  . Appendectomy     No family history on file. History  Substance Use Topics  . Smoking status: Never Smoker   . Smokeless tobacco: Not on file  . Alcohol Use: No    Review of Systems  Constitutional: Negative for fever.  Psychiatric/Behavioral: Negative for suicidal ideas and hallucinations.  All other systems reviewed and are negative.   Allergies  Ibuprofen and Tramadol  Home Medications   Prior to Admission medications    Medication Sig Start Date End Date Taking? Authorizing Provider  acetaminophen (TYLENOL) 500 MG tablet Take 1,000-1,500 mg by mouth 5 (five) times daily as needed for pain.    Historical Provider, MD  butalbital-acetaminophen-caffeine (FIORICET) 50-325-40 MG per tablet Take 1 tablet by mouth every 4 (four) hours as needed for headache. 07/17/13 07/17/14  Tammy L. Triplett, PA-C   There were no vitals taken for this visit. Physical Exam  Nursing note and vitals reviewed. Constitutional: He is oriented to person, place, and time. He appears well-developed and well-nourished. No distress.  HENT:  Head: Normocephalic and atraumatic.  Eyes: EOM are normal.  Neck: Neck supple. No tracheal deviation present.  Cardiovascular: Normal rate.   Pulmonary/Chest: Effort normal. No respiratory distress.  Musculoskeletal: Normal range of motion.  Neurological: He is alert and oriented to person, place, and time.  Skin: Skin is warm and dry.  Psychiatric: He has a normal mood and affect. His behavior is normal.    ED Course  Procedures (including critical care time)  DIAGNOSTIC STUDIES: Oxygen Saturation is 100% on RA, Normal by my interpretation.    COORDINATION OF CARE:  4:54 PM- Discussed treatment plan with patient, and the patient agreed to the plan.    Labs Review Labs Reviewed  CBC - Abnormal; Notable for the following:    WBC 18.2 (*)    All other components within normal limits  COMPREHENSIVE METABOLIC PANEL - Abnormal; Notable for the following:    Glucose, Bld 128 (*)  All other components within normal limits  URINE RAPID DRUG SCREEN (HOSP PERFORMED) - Abnormal; Notable for the following:    Opiates POSITIVE (*)    Benzodiazepines POSITIVE (*)    Tetrahydrocannabinol POSITIVE (*)    All other components within normal limits  ETHANOL    Imaging Review No results found.   EKG Interpretation None      MDM   Final diagnoses:  Opiate dependence  Benzodiazepine  dependence   PT with polysubstance abuse and opiate dependence [facility] However, he needs to be detoxed from the medications. Will have him moved to Psych ED for evaluation. Denies SI/HI/AVH.     I personally performed the services described in this documentation, which was scribed in my presence. The recorded information has been reviewed and is accurate.     Arthor CaptainAbigail Garnell Phenix, PA-C 03/21/14 1046

## 2014-03-17 ENCOUNTER — Inpatient Hospital Stay (HOSPITAL_COMMUNITY)
Admission: AD | Admit: 2014-03-17 | Discharge: 2014-03-23 | DRG: 897 | Disposition: A | Discharge status: Home / Self Care | Payer: 59 | Source: Intra-hospital | Attending: Psychiatry | Admitting: Psychiatry

## 2014-03-17 ENCOUNTER — Encounter (HOSPITAL_COMMUNITY): Payer: Self-pay | Admitting: *Deleted

## 2014-03-17 ENCOUNTER — Encounter (HOSPITAL_COMMUNITY): Payer: Self-pay | Admitting: Psychiatry

## 2014-03-17 DIAGNOSIS — F1320 Sedative, hypnotic or anxiolytic dependence, uncomplicated: Secondary | ICD-10-CM | POA: Diagnosis present

## 2014-03-17 DIAGNOSIS — F1994 Other psychoactive substance use, unspecified with psychoactive substance-induced mood disorder: Secondary | ICD-10-CM

## 2014-03-17 DIAGNOSIS — F1910 Other psychoactive substance abuse, uncomplicated: Secondary | ICD-10-CM

## 2014-03-17 DIAGNOSIS — F320 Major depressive disorder, single episode, mild: Secondary | ICD-10-CM | POA: Diagnosis present

## 2014-03-17 DIAGNOSIS — F1120 Opioid dependence, uncomplicated: Secondary | ICD-10-CM | POA: Diagnosis present

## 2014-03-17 DIAGNOSIS — F1920 Other psychoactive substance dependence, uncomplicated: Principal | ICD-10-CM | POA: Diagnosis present

## 2014-03-17 DIAGNOSIS — F1210 Cannabis abuse, uncomplicated: Secondary | ICD-10-CM | POA: Diagnosis present

## 2014-03-17 DIAGNOSIS — F411 Generalized anxiety disorder: Secondary | ICD-10-CM | POA: Diagnosis present

## 2014-03-17 MED ORDER — CLONIDINE HCL 0.1 MG PO TABS
0.1000 mg | ORAL_TABLET | Freq: Four times a day (QID) | ORAL | Status: AC
  Administered 2014-03-17 – 2014-03-19 (×10): 0.1 mg via ORAL
  Filled 2014-03-17 (×13): qty 1

## 2014-03-17 MED ORDER — ACETAMINOPHEN 325 MG PO TABS
650.0000 mg | ORAL_TABLET | Freq: Four times a day (QID) | ORAL | Status: DC | PRN
  Administered 2014-03-20 – 2014-03-22 (×3): 650 mg via ORAL
  Filled 2014-03-17 (×3): qty 2

## 2014-03-17 MED ORDER — CHLORDIAZEPOXIDE HCL 25 MG PO CAPS: 25.0000 mg | ORAL_CAPSULE | Freq: Four times a day (QID) | ORAL | Status: AC | PRN

## 2014-03-17 MED ORDER — ONDANSETRON 4 MG PO TBDP
4.0000 mg | ORAL_TABLET | Freq: Four times a day (QID) | ORAL | Status: AC | PRN
  Administered 2014-03-18: 4 mg via ORAL
  Filled 2014-03-17: qty 1

## 2014-03-17 MED ORDER — CHLORDIAZEPOXIDE HCL 25 MG PO CAPS
25.0000 mg | ORAL_CAPSULE | ORAL | Status: AC
  Administered 2014-03-20 (×2): 25 mg via ORAL
  Filled 2014-03-17 (×2): qty 1

## 2014-03-17 MED ORDER — CHLORDIAZEPOXIDE HCL 25 MG PO CAPS
25.0000 mg | ORAL_CAPSULE | Freq: Four times a day (QID) | ORAL | Status: AC
  Administered 2014-03-17 – 2014-03-18 (×6): 25 mg via ORAL
  Filled 2014-03-17 (×6): qty 1

## 2014-03-17 MED ORDER — CLONIDINE HCL 0.1 MG PO TABS
0.1000 mg | ORAL_TABLET | Freq: Every day | ORAL | Status: AC
  Administered 2014-03-22 – 2014-03-23 (×2): 0.1 mg via ORAL
  Filled 2014-03-17 (×2): qty 1

## 2014-03-17 MED ORDER — TRAZODONE HCL 50 MG PO TABS
50.0000 mg | ORAL_TABLET | Freq: Every evening | ORAL | Status: DC | PRN
  Administered 2014-03-17 – 2014-03-18 (×2): 50 mg via ORAL
  Filled 2014-03-17 (×2): qty 1

## 2014-03-17 MED ORDER — CHLORDIAZEPOXIDE HCL 25 MG PO CAPS
25.0000 mg | ORAL_CAPSULE | Freq: Every day | ORAL | Status: AC
  Administered 2014-03-21: 25 mg via ORAL
  Filled 2014-03-17: qty 1

## 2014-03-17 MED ORDER — DICYCLOMINE HCL 20 MG PO TABS: 20.0000 mg | ORAL_TABLET | Freq: Four times a day (QID) | ORAL | Status: AC | PRN

## 2014-03-17 MED ORDER — CHLORDIAZEPOXIDE HCL 25 MG PO CAPS
25.0000 mg | ORAL_CAPSULE | Freq: Three times a day (TID) | ORAL | Status: AC
  Administered 2014-03-19 (×3): 25 mg via ORAL
  Filled 2014-03-17 (×3): qty 1

## 2014-03-17 MED ORDER — NAPROXEN 500 MG PO TABS
500.0000 mg | ORAL_TABLET | Freq: Two times a day (BID) | ORAL | Status: AC | PRN
  Administered 2014-03-20 – 2014-03-21 (×2): 500 mg via ORAL
  Filled 2014-03-17 (×2): qty 1

## 2014-03-17 MED ORDER — CLONIDINE HCL 0.1 MG PO TABS
0.1000 mg | ORAL_TABLET | ORAL | Status: AC
  Administered 2014-03-20 – 2014-03-21 (×4): 0.1 mg via ORAL
  Filled 2014-03-17 (×4): qty 1

## 2014-03-17 MED ORDER — METHOCARBAMOL 500 MG PO TABS
500.0000 mg | ORAL_TABLET | Freq: Three times a day (TID) | ORAL | Status: AC | PRN
  Administered 2014-03-17 – 2014-03-21 (×5): 500 mg via ORAL
  Filled 2014-03-17 (×5): qty 1

## 2014-03-17 MED ORDER — MAGNESIUM HYDROXIDE 400 MG/5ML PO SUSP: 30.0000 mL | Freq: Every day | ORAL | Status: DC | PRN

## 2014-03-17 MED ORDER — ALUM & MAG HYDROXIDE-SIMETH 200-200-20 MG/5ML PO SUSP: 30.0000 mL | ORAL | Status: DC | PRN

## 2014-03-17 MED ORDER — HYDROXYZINE HCL 25 MG PO TABS
25.0000 mg | ORAL_TABLET | Freq: Four times a day (QID) | ORAL | Status: AC | PRN
  Administered 2014-03-17 – 2014-03-18 (×2): 25 mg via ORAL
  Filled 2014-03-17 (×2): qty 1

## 2014-03-17 MED ORDER — LOPERAMIDE HCL 2 MG PO CAPS: 2.0000 mg | ORAL_CAPSULE | ORAL | Status: AC | PRN
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
## 2014-03-17 NOTE — [facility] Group Notes (Signed)
Adult Psychoeducational Group Note  Date:  03/17/2014 Time:  10:41 PM  Group Topic/Focus:  AA Meeting  Participation Level:  Minimal  Participation Quality:  Appropriate and Attentive  Affect:  Flat  Cognitive:  Appropriate  Insight: Good  Engagement in Group:  Limited  Modes of Intervention:  Discussion and Education  Additional Comments:  Gradie attended group.  Seneca Gadbois A Lillia AbedLindsay 03/17/2014, 10:41 PM

## 2014-03-17 NOTE — BH Assessment (Signed)
[facility] Assessment Progress Note  Pt accepted to bed 303-1 by Nanine MeansJamison Lord to Dr. Dub MikesLugo. Pt signed voluntary consent forms and will be transported by [facility].

## 2014-03-17 NOTE — ED Notes (Signed)
Patient resting comfortably with eyes closed, respirations even and unlabored.

## 2014-03-17 NOTE — Consult Note (Signed)
Nicholas [name]   Reason for [name]:  Benzodiazepine and opiate dependency Referring Physician:  EDP [name] is an 23 y.o. male. Total Time spent with patient: 20 minutes  Assessment: AXIS I:  Substance Abuse and Substance Induced Mood Disorder AXIS II:  Deferred AXIS III:   Past Medical History  Diagnosis Date  . Migraine    AXIS IV:  other psychosocial or environmental problems, problems related to social environment and problems with primary support group AXIS V:  41-50 serious symptoms  Plan:  Recommend psychiatric Inpatient admission when medically cleared.Dr. De Nurse assessed and concur with the treatment plan.  Subjective:   Nicholas [name] is a 24 y.o. male patient admitted with benzodiazepine and opiate dependency.  HPI:  23 year old white male requesting detox from opiates. Patient reports that he has been addicted to opiates for approximately 18 months. Patient reports his last usage of oxycodone was 30 mg and it was this morning. Patient reports that he uses 150-19m daily. Patient denies HI; however, when he does not use he "gets violent, tearing up stuff." Additionally, he also reports he wants to hurt himself or hurt others when he does not use opiates. Patient denies physical, sexual and emotional abuse. Patient reports that he has also used oxycodone and phenethanol as well as15 mg benzodiazepines, acid, and mushrooms in the past. Patient denies using opiates in high school. He also denies heroine or crack usage. Patients UDS was positive for opiates. He attempted detoxification on his own last year without success. Patient reports that he experiences a seizure Tuesday of last week when he attempted to detox on his own. Patient reports that he is experiencing withdrawal symptoms.  Today, he told the MD that he tends to get violent when he withdrawals without medications to help him. He has been using opiates for the past year.  When he stopped for  eight days one time, he started having auditory and visual hallucinations.  He does admit to having used hallucinogenics in his past.  HPI Elements:   Location:  generalized. Quality:  acute. Severity:  severe. Timing:  constant. Duration:  1 year. Context:  stressors.  Past Psychiatric History: Past Medical History  Diagnosis Date  . Migraine     reports that he has never smoked. He does not have any smokeless tobacco history on file. He reports that he uses illicit drugs (Marijuana and Cocaine). He reports that he does not drink alcohol. History reviewed. No pertinent family history. Family History Substance Abuse: Yes, Describe: Family Supports: Yes, List: Living Arrangements: Parent Can pt return to current living arrangement?: Yes   Allergies:  No Active Allergies  ACT Assessment Complete:  Yes:    Educational Status    Risk to Self: Risk to self Suicidal Ideation: No Suicidal Intent: No Is patient at risk for suicide?: No Suicidal Plan?: No Access to Means: No What has been your use of drugs/alcohol within the last 12 months?: Opiates Previous Attempts/Gestures: No How many times?: 0 Other Self Harm Risks: NA Triggers for Past Attempts:  (NA) Intentional Self Injurious Behavior: None Family Suicide History: No Recent stressful life event(s): Conflict (Comment);Job Loss;Financial Problems Persecutory voices/beliefs?: No Depression: Yes Depression Symptoms: Tearfulness;Guilt;Loss of interest in usual pleasures;Feeling worthless/self pity;Insomnia;Fatigue Substance abuse history and/or treatment for substance abuse?: Yes Suicide prevention information given to non-admitted patients: Not applicable  Risk to Others: Risk to Others Homicidal Ideation: No Thoughts of Harm to Others: No Current Homicidal Intent: No Current  Homicidal Plan: No Access to Homicidal Means: No Identified Victim: NA History of harm to others?: No Assessment of Violence: None Noted Violent  Behavior Description: NA Does patient have access to weapons?: No Criminal Charges Pending?: No Does patient have a court date: No  Abuse:    Prior Inpatient Therapy: Prior Inpatient Therapy Prior Inpatient Therapy: No Prior Therapy Dates: NA Prior Therapy Facilty/Provider(s): NA Reason for Treatment: NA  Prior Outpatient Therapy: Prior Outpatient Therapy Prior Outpatient Therapy: No Prior Therapy Dates: NA Prior Therapy Facilty/Provider(s): NA Reason for Treatment: NA  Additional Information: Additional Information 1:1 In Past 12 Months?: No CIRT Risk: No Elopement Risk: No Does patient have medical clearance?: Yes                  Objective: Blood pressure 136/79, pulse 76, temperature 97.4 F (36.3 C), temperature source Oral, resp. rate 16, SpO2 97.00%.There is no weight on file to calculate BMI. Results for orders placed during the hospital encounter of 03/16/14 (from the past 72 hour(s))  CBC     Status: Abnormal   Collection Time    03/16/14  3:04 PM      Result Value Ref Range   WBC 18.2 (*) 4.0 - 10.5 K/uL   RBC 4.28  4.22 - 5.81 MIL/uL   Hemoglobin 13.6  13.0 - 17.0 g/dL   HCT 39.6  39.0 - 52.0 %   MCV 92.5  78.0 - 100.0 fL   MCH 31.8  26.0 - 34.0 pg   MCHC 34.3  30.0 - 36.0 g/dL   RDW 12.7  11.5 - 15.5 %   Platelets 260  150 - 400 K/uL  COMPREHENSIVE METABOLIC PANEL     Status: Abnormal   Collection Time    03/16/14  3:04 PM      Result Value Ref Range   Sodium 142  137 - 147 mEq/L   Potassium 3.7  3.7 - 5.3 mEq/L   Chloride 100  96 - 112 mEq/L   CO2 27  19 - 32 mEq/L   Glucose, Bld 128 (*) 70 - 99 mg/dL   BUN 9  6 - 23 mg/dL   Creatinine, Ser 0.75  0.50 - 1.35 mg/dL   Calcium 10.2  8.4 - 10.5 mg/dL   Total Protein 7.8  6.0 - 8.3 g/dL   Albumin 4.4  3.5 - 5.2 g/dL   AST 17  0 - 37 U/L   ALT 21  0 - 53 U/L   Alkaline Phosphatase 83  39 - 117 U/L   Total Bilirubin 0.3  0.3 - 1.2 mg/dL   GFR calc non Af Amer >90  >90 mL/min   GFR calc Af  Amer >90  >90 mL/min   Comment: (NOTE)     The eGFR has been calculated using the CKD EPI equation.     This calculation has not been validated in all clinical situations.     eGFR's persistently <90 mL/min signify possible Chronic Kidney     Disease.  ETHANOL     Status: None   Collection Time    03/16/14  3:04 PM      Result Value Ref Range   Alcohol, Ethyl (B) <11  0 - 11 mg/dL   Comment:            LOWEST DETECTABLE LIMIT FOR     SERUM ALCOHOL IS 11 mg/dL     FOR MEDICAL PURPOSES ONLY  URINE RAPID DRUG SCREEN (HOSP PERFORMED)       Status: Abnormal   Collection Time    03/16/14  3:22 PM      Result Value Ref Range   Opiates POSITIVE (*) NONE DETECTED   Cocaine NONE DETECTED  NONE DETECTED   Benzodiazepines POSITIVE (*) NONE DETECTED   Amphetamines NONE DETECTED  NONE DETECTED   Tetrahydrocannabinol POSITIVE (*) NONE DETECTED   Barbiturates NONE DETECTED  NONE DETECTED   Comment:            DRUG SCREEN FOR MEDICAL PURPOSES     ONLY.  IF CONFIRMATION IS NEEDED     FOR ANY PURPOSE, NOTIFY LAB     WITHIN 5 DAYS.                LOWEST DETECTABLE LIMITS     FOR URINE DRUG SCREEN     Drug Class       Cutoff (ng/mL)     Amphetamine      1000     Barbiturate      200     Benzodiazepine   200     Tricyclics       300     Opiates          300     Cocaine          300     THC              50   Labs are reviewed and are pertinent for no medical issues noted.  Current Facility-Administered Medications  Medication Dose Route Frequency Provider Last Rate Last Dose  . chlordiazePOXIDE (LIBRIUM) capsule 25 mg  25 mg Oral Q6H PRN Fran E Hobson, NP      . chlordiazePOXIDE (LIBRIUM) capsule 25 mg  25 mg Oral QID Fran E Hobson, NP   25 mg at 03/17/14 0151   Followed by  . [START ON 03/18/2014] chlordiazePOXIDE (LIBRIUM) capsule 25 mg  25 mg Oral TID Fran E Hobson, NP       Followed by  . [START ON 03/19/2014] chlordiazePOXIDE (LIBRIUM) capsule 25 mg  25 mg Oral BH-qamhs Fran E Hobson, NP        Followed by  . [START ON 03/20/2014] chlordiazePOXIDE (LIBRIUM) capsule 25 mg  25 mg Oral Daily Fran E Hobson, NP      . cloNIDine (CATAPRES) tablet 0.1 mg  0.1 mg Oral QID Fran E Hobson, NP   0.1 mg at 03/17/14 0151   Followed by  . [START ON 03/19/2014] cloNIDine (CATAPRES) tablet 0.1 mg  0.1 mg Oral BH-qamhs Fran E Hobson, NP       Followed by  . [START ON 03/22/2014] cloNIDine (CATAPRES) tablet 0.1 mg  0.1 mg Oral QAC breakfast Fran E Hobson, NP      . dicyclomine (BENTYL) tablet 20 mg  20 mg Oral Q6H PRN Abigail Harris, PA-C   20 mg at 03/17/14 0501  . hydrOXYzine (ATARAX/VISTARIL) tablet 25 mg  25 mg Oral Q6H PRN Abigail Harris, PA-C   25 mg at 03/17/14 0501  . loperamide (IMODIUM) capsule 2-4 mg  2-4 mg Oral PRN Abigail Harris, PA-C      . methocarbamol (ROBAXIN) tablet 500 mg  500 mg Oral Q8H PRN Abigail Harris, PA-C   500 mg at 03/16/14 1941  . naproxen (NAPROSYN) tablet 500 mg  500 mg Oral BID PRN Abigail Harris, PA-C   500 mg at 03/17/14 0501  . ondansetron (ZOFRAN-ODT) disintegrating tablet 4 mg  4 mg Oral Q6H PRN Abigail   Harris, PA-C      . traZODone (DESYREL) tablet 50 mg  50 mg Oral QHS PRN Fran E Hobson, NP   50 mg at 03/16/14 2309   Current Outpatient Prescriptions  Medication Sig Dispense Refill  . 5-Hydroxytryptophan (5-HTP PO) Take 1 tablet by mouth daily.      . ALPRAZOLAM PO Take by mouth.      . CLONAZEPAM PO Take by mouth.      . Ginkgo Biloba 40 MG TABS Take 1 tablet by mouth daily.      . HYDROCODONE-ACETAMINOPHEN PO Take by mouth.      . HYDROmorphone HCl (DILAUDID PO) Take by mouth.      . OXYCODONE HCL PO Take by mouth.      . OXYMORPHONE HCL PO Take by mouth.        Psychiatric Specialty Exam:     Blood pressure 136/79, pulse 76, temperature 97.4 F (36.3 C), temperature source Oral, resp. rate 16, SpO2 97.00%.There is no weight on file to calculate BMI.  General Appearance: Casual  Eye Contact::  Fair  Speech:  Normal Rate  Volume:  Decreased  Mood:   Anxious  Affect:  Congruent  Thought Process:  Coherent  Orientation:  Full (Time, Place, and Person)  Thought Content:  WDL  Suicidal Thoughts:  No  Homicidal Thoughts:  No  Memory:  Immediate;   Fair Recent;   Fair Remote;   Fair  Judgement:  Poor  Insight:  Fair  Psychomotor Activity:  Decreased  Concentration:  Fair  Recall:  Fair  Fund of Knowledge:Fair  Language: Good  Akathisia:  No  Handed:  Right  AIMS (if indicated):     Assets:  Housing Physical Health Resilience Social Support Vocational/Educational  Sleep:      Musculoskeletal: Strength & Muscle Tone: within normal limits Gait & Station: normal Patient leans: Right  Treatment Plan Summary: Daily contact with patient to assess and evaluate symptoms and progress in treatment Medication management Admit to inpatient for detox   Jamison Lord, PMH-NP 03/17/2014 10:51 AM I have personally seen the patient and agreed with the findings and involved in the treatment plan.  , MD 

## 2014-03-17 NOTE — ED Notes (Signed)
Patient remains awake. Resting quietly in bed.

## 2014-03-17 NOTE — Progress Notes (Signed)
RTS: Referral Faxed due to Bed availability.  Nicholas [name] Disposition MHT

## 2014-03-17 NOTE — Progress Notes (Signed)
RTS: Pt declined due to SI and HI [facility]: No Beds   Dozier Berkovich Cathey Disposition MHT

## 2014-03-17 NOTE — Tx Team (Signed)
Initial Interdisciplinary Treatment Plan  PATIENT STRENGTHS: (choose at least two) Ability for insight Average or above average intelligence Communication skills General fund of knowledge Motivation for treatment/growth Supportive family/friends  PATIENT STRESSORS: Educational concerns Paediatric nurseinancial difficulties Legal issue Substance abuse   PROBLEM LIST: Problem List/Patient Goals Date to be addressed Date deferred Reason deferred Estimated date of resolution  Substance abuse 03-17-14   dc  Suicide risk 03-17-14   dc                                             DISCHARGE CRITERIA:  Ability to meet basic life and health needs Improved stabilization in mood, thinking, and/or behavior Motivation to continue treatment in a less acute level of care Reduction of life-threatening or endangering symptoms to within safe limits Verbal commitment to aftercare and medication compliance  PRELIMINARY DISCHARGE PLAN: Attend aftercare/continuing care group Attend 12-step recovery group Outpatient therapy Return to previous living arrangement  PATIENT/FAMIILY INVOLVEMENT: This treatment plan has been presented to and reviewed with the patient, [name], and/or family member, .  The patient and family have been given the opportunity to ask questions and make suggestions.  Malva LimesLinsey Lacy [name] 03/17/2014, 3:49 PM

## 2014-03-18 ENCOUNTER — Encounter (HOSPITAL_COMMUNITY): Payer: Self-pay | Admitting: Psychiatry

## 2014-03-18 DIAGNOSIS — F1910 Other psychoactive substance abuse, uncomplicated: Secondary | ICD-10-CM

## 2014-03-18 DIAGNOSIS — F1120 Opioid dependence, uncomplicated: Secondary | ICD-10-CM | POA: Diagnosis present

## 2014-03-18 DIAGNOSIS — F1920 Other psychoactive substance dependence, uncomplicated: Secondary | ICD-10-CM

## 2014-03-18 DIAGNOSIS — F1994 Other psychoactive substance use, unspecified with psychoactive substance-induced mood disorder: Secondary | ICD-10-CM

## 2014-03-18 MED ORDER — CARBAMAZEPINE ER 200 MG PO TB12
200.0000 mg | ORAL_TABLET | Freq: Two times a day (BID) | ORAL | Status: DC
  Administered 2014-03-18 – 2014-03-23 (×10): 200 mg via ORAL
  Filled 2014-03-18: qty 28
  Filled 2014-03-18 (×2): qty 1
  Filled 2014-03-18: qty 28
  Filled 2014-03-18 (×5): qty 1
  Filled 2014-03-18: qty 28
  Filled 2014-03-18: qty 1
  Filled 2014-03-18: qty 28
  Filled 2014-03-18 (×4): qty 1

## 2014-03-18 NOTE — [facility] Group Notes (Signed)
[facility] LCSW Group Therapy  03/18/2014 12:12 PM  Type of Therapy:  Group Therapy  Participation Level:  Active  Participation Quality:  Appropriate, Sharing and Supportive  Affect:  Appropriate  Cognitive:  Alert and Oriented  Insight:  Developing/Improving, Engaged and Supportive  Engagement in Therapy:  Developing/Improving, Engaged and Supportive  Modes of Intervention:  Discussion, Education, Exploration, Rapport Building and Support  Summary of Progress/Problems: Pt was engaged in group discussion and was able to share unhealthy and healthy supports.  Pt was also able to identify an effective coping skills.  Pt was supportive of other's and engaged in other's sharing their experiences.   Seabron SpatesRachel Anne Wiley Flicker 03/18/2014, 12:12 PM

## 2014-03-18 NOTE — [facility] Group Notes (Signed)
[facility] Group Notes:  (Nursing/MHT/Case Management/Adjunct)  Date:  03/18/2014  Time:  0900 am  Type of Therapy:  self inventory group  Participation Level:  Minimal  Participation Quality:  Resistant  Affect:  Flat  Cognitive:  Lacking  Insight:  Lacking  Engagement in Group:  Lacking  Modes of Intervention:  Support  Summary of Progress/Problems: Nicholas [name] came in late to group with minimal participation.  He did inform the group that he was here due to addiction to pain killers.  Cresenciano GenreCaroline Evans [employer] 03/18/2014, 11:15 AM

## 2014-03-18 NOTE — Progress Notes (Signed)
D. Pt has been up and has been active in milieu this evening, has attended and participated in various milieu activities. Pt spoke about how he enjoys using drugs and likes the feeling that it gives to him. Pt reports that he can get anything that he wants and all he has to do is make a phone call. Pt spoke about how he has tried to detox himself in the past and was unable to do so and also reports that he feels better about himself when he is using drugs. Pt presents as anxious and fidgety this evening. A. Support and encouragement provided, medication education given. R. Pt verbalized understanding, safety maintained.

## 2014-03-18 NOTE — [facility] Suicide Risk Assessment (Signed)
   Nursing information obtained from:  Patient Demographic factors:  Male;Adolescent or young adult Current Mental Status:  NA Loss Factors:  NA Historical Factors:  Impulsivity;NA Risk Reduction Factors:  Sense of responsibility to family Total Time spent with patient: 30 minutes  CLINICAL FACTORS:   Severe Anxiety and/or Agitation Alcohol/Substance Abuse/Dependencies Unstable or Poor Therapeutic Relationship  Psychiatric Specialty Exam: Physical Exam  Psychiatric: His speech is normal. Thought content normal. His mood appears anxious. His affect is labile. He is aggressive. Cognition and memory are normal. He expresses impulsivity.    Review of Systems  Constitutional: Negative.   HENT: Negative.   Eyes: Negative.   Respiratory: Negative.   Cardiovascular: Negative.   Gastrointestinal: Negative.   Genitourinary: Negative.   Musculoskeletal: Negative.   Skin: Negative.   Neurological: Positive for tremors.  Endo/Heme/Allergies: Negative.   Psychiatric/Behavioral: Positive for substance abuse. The patient is nervous/anxious and has insomnia.     Blood pressure 133/76, pulse 86, temperature 97.5 F (36.4 C), temperature source Oral, resp. rate 20, height 6\' 2"  (1.88 m), weight 94.348 kg (208 lb), SpO2 98.00%.Body mass index is 26.69 kg/(m^2).  General Appearance: Fairly Groomed  Patent attorneyye Contact::  Good  Speech:  Clear and Coherent and Normal Rate  Volume:  Increased  Mood:  Angry  Affect:  Labile  Thought Process:  Goal Directed  Orientation:  Full (Time, Place, and Person)  Thought Content:  Negative  Suicidal Thoughts:  No  Homicidal Thoughts:  No  Memory:  Immediate;   Fair Recent;   Fair Remote;   Fair  Judgement:  Poor  Insight:  Lacking  Psychomotor Activity:  Increased  Concentration:  Fair  Recall:  FiservFair  Fund of Knowledge:Fair  Language: Fair  Akathisia:  No  Handed:  Right  AIMS (if indicated):     Assets:  Communication Skills Desire for  Improvement Physical Health  Sleep:  Number of Hours: 4.75   Musculoskeletal: Strength & Muscle Tone: within normal limits Gait & Station: normal Patient leans: N/A  COGNITIVE FEATURES THAT CONTRIBUTE TO RISK:  Closed-mindedness Polarized thinking    SUICIDE RISK:   Minimal: No identifiable suicidal ideation.  Patients presenting with no risk factors but with morbid ruminations; may be classified as minimal risk based on the severity of the depressive symptoms  PLAN OF CARE:1. Admit for crisis management and stabilization. 2. Medication management to reduce current symptoms to base line and improve the     patient's overall level of functioning 3. Treat health problems as indicated. 4. Develop treatment plan to decrease risk of relapse upon discharge and the need for     readmission. 5. Psycho-social education regarding relapse prevention and self care. 6. Health care follow up as needed for medical problems. 7. Restart home medications where appropriate.   I certify that inpatient services furnished can reasonably be expected to improve the patient's condition.  Thedore MinsMojeed Hugh Kamara, MD 03/18/2014, 11:16 AM

## 2014-03-18 NOTE — Progress Notes (Signed)
Pt did not attend evening group. 

## 2014-03-18 NOTE — Progress Notes (Signed)
Patient ID: [name], male   DOB: 10/03/1991, 23 y.o.   MRN: [id number] D: Patient has been experiencing withdrawal symptoms such as diarrhea, nausea, tremors, agitation and chilling.  Patient continues on the librium and clonidine protocol.  His blood pressure has remained stable.  Patient attending group this morning and had minimal interaction with his peers.  He stated, "I am here because I became addicted to pain pills."  His main concern has been lack of sleep, reporting only 3 hours for the past few days.  Advised patient to talk with NP regarding some sleep medication, as trazodone doesn't seem to be working.  He denies SI/HI/AVH.  Patient was given zofran for nausea and was effective.  He denies any SI/HI/AVH.  His goal is to attend NA meetings upon discharge.  A: Continue to monitor medication management and MD orders. Safety checks completed every 15 minutes per protocol.  R: patient is receptive to staff; his behavior is appropriate to situation.
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
## 2014-03-18 NOTE — Progress Notes (Signed)
Adult Psychoeducational Group Note  Date:  03/18/2014 Time:  5:32 PM  Group Topic/Focus:  Therapeutic Activity   Participation Level:  Active  Participation Quality:  Attentive  Affect:  Appropriate  Cognitive:  Oriented  Insight: Appropriate  Engagement in Group:  Engaged  Modes of Intervention:  Activity  Additional Comments:  Saturday's topic focused on healthy coping skills.  Pts played coping skills pictionary, which gives pts different ideas to use if they are experiencing a difficult time.    Nicholas [name] 03/18/2014, 5:32 PM

## 2014-03-18 NOTE — H&P (Signed)
Psychiatric Admission Assessment Adult  Patient Identification:  Nicholas [name] Date of Evaluation:  03/18/2014 Chief Complaint:  substance abuse substance induced mood disorder History of Present Illness:: 23 year old white male requesting detox from opiates. Patient reports that he has been addicted to opiates for approximately 18 months. Patient reports his last usage of oxycodone was 30 mg and it was this morning. Patient reports that he uses 150-143m daily. Patient denies HI; however, when he does not use he "gets violent, tearing up stuff." Additionally, he also reports he wants to hurt himself or hurt others when he does not use opiates. Patient denies physical, sexual and emotional abuse. Patient reports that he has also used oxycodone and phenethanol as well as15 mg benzodiazepines, acid, and mushrooms in the past. Patient denies using opiates in high school. He also denies heroine or crack usage. Patients UDS was positive for opiates. He attempted detoxification on his own last year without success. Patient reports that he experiences a seizure Tuesday of last week when he attempted to detox on his own. Patient reports that he is experiencing withdrawal symptoms. Today, he told the MD that he tends to get violent when he withdrawals without medications to help him. He has been using opiates for the past year. When he stopped for eight days one time, he started having auditory and visual hallucinations. He does admit to having used hallucinogenics in his past.   Subjective: Pt seen and chart reviewed. During admission assessment, pt denies SI, HI, and AVH, contracts for safety. Pt reports that he felt that he was encourage to come into the hospital  by his mother. Pt states that his mother was concerned about him and nervous that he "might do something stupid and go to jail for a long time or something like that or maybe get hurt or OD".    Elements:  Location:  Generalized, Inpatient  [facility]. Quality:  Worsening. Severity:  Severe. Timing:  Constant. Duration:  Chronic. Associated Signs/Synptoms: Depression Symptoms:  depressed mood, anhedonia, psychomotor agitation, feelings of worthlessness/guilt, hopelessness, anxiety, (Hypo) Manic Symptoms:  Impulsivity, Irritable Mood, Anxiety Symptoms:  Excessive Worry, Psychotic Symptoms:  Denies PTSD Symptoms: Denies Total Time spent with patient: 30 minutes  Psychiatric Specialty Exam: Physical Exam  Review of Systems  Constitutional: Negative.   HENT: Negative.   Eyes: Negative.   Respiratory: Negative.   Cardiovascular: Negative.   Gastrointestinal: Negative.   Genitourinary: Negative.   Musculoskeletal: Negative.   Skin: Negative.   Neurological: Negative.   Endo/Heme/Allergies: Negative.   Psychiatric/Behavioral: Negative.     Blood pressure 126/74, pulse 97, temperature 97.5 F (36.4 C), temperature source Oral, resp. rate 20, height _0  (1.88 m), weight 94.348 kg (208 lb), SpO2 98.00%.Body mass index is 26.69 kg/(m^2).  General Appearance: Casual  Eye Contact::  Good  Speech:  Clear and Coherent  Volume:  Normal  Mood:  Anxious and Depressed  Affect:  Appropriate  Thought Process:  Circumstantial  Orientation:  Full (Time, Place, and Person)  Thought Content:  WDL  Suicidal Thoughts:  No  Homicidal Thoughts:  No  Memory:  Immediate;   Good Recent;   Good Remote;   Good  Judgement:  Fair  Insight:  Good  Psychomotor Activity:  Normal  Concentration:  Good  Recall:  Good  Fund of Knowledge:Good  Language: Good  Akathisia:  NA  Handed:  Right  AIMS (if indicated):     Assets:  Communication Skills Desire for Improvement Resilience  Sleep:  Number of Hours: 4.75    Musculoskeletal: Strength & Muscle Tone: within normal limits Gait & Station: normal Patient leans: N/A  Past Psychiatric History: Diagnosis: THC, Opiates, Benzo, Psychedelic dependence  Hospitalizations: Denies   Outpatient Care: 2015, [facility] (cannot recall provider) [facility]"  Substance Abuse Care: Denies  Self-Mutilation: Denies   Suicidal Attempts: Denies  Violent Behaviors: Breaking objects: porch railing (2015)   Past Medical History:   Past Medical History  Diagnosis Date  . Migraine    Seizure History:  2014, blacked out, possible withdrawal seizure Allergies:  No Known Allergies PTA Medications: Prescriptions prior to admission  Medication Sig Dispense Refill  . 5-Hydroxytryptophan (5-HTP PO) Take 1 tablet by mouth daily.      Marland Kitchen ALPRAZOLAM PO Take by mouth.      . CLONAZEPAM PO Take by mouth.      . Ginkgo Biloba 40 MG TABS Take 1 tablet by mouth daily.      Marland Kitchen HYDROCODONE-ACETAMINOPHEN PO Take by mouth.      Marland Kitchen HYDROmorphone HCl (DILAUDID PO) Take by mouth.      . OXYCODONE HCL PO Take by mouth.      . OXYMORPHONE HCL PO Take by mouth.        Previous Psychotropic Medications:  Medication/Dose  SEE MAR               Substance Abuse History in the last 12 months:  yes  Consequences of Substance Abuse: NA  Social History:  reports that he has never smoked. He does not have any smokeless tobacco history on file. He reports that he uses illicit drugs (Marijuana, Cocaine, Benzodiazepines, Oxycodone, and Hydrocodone). He reports that he does not drink alcohol. Additional Social History: History of alcohol / drug use?: Yes Name of Substance 1: opiates 1 - Age of First Use: 20 1 - Amount (size/oz): 80 gm to 160gm per day 1 - Frequency: qd x 2 yrs                  Current Place of Residence: [place] of Birth: [place] Family Members: Mom, grandmother, brother, grandfather, cousin Marital Status:  Single Children:  Sons:  Daughters: Relationships: relationship Education: Some college Educational Problems/Performance: ADHD Religious Beliefs/Practices: Buddhism History of Abuse (Emotional/Phsycial/Sexual): Denies Pensions consultant;  Conservation officer, nature History: Denies Legal History: "50 felonies, but dropped to 1 misdemeanor, grand larceny" Hobbies/Interests: Video Games, Microcology, Research scientist (medical)  Family History:  History reviewed. No pertinent family history.  Results for orders placed during the hospital encounter of 03/16/14 (from the past 72 hour(s))  CBC     Status: Abnormal   Collection Time    03/16/14  3:04 PM      Result Value Ref Range   WBC 18.2 (*) 4.0 - 10.5 K/uL   RBC 4.28  4.22 - 5.81 MIL/uL   Hemoglobin 13.6  13.0 - 17.0 g/dL   HCT 39.6  39.0 - 52.0 %   MCV 92.5  78.0 - 100.0 fL   MCH 31.8  26.0 - 34.0 pg   MCHC 34.3  30.0 - 36.0 g/dL   RDW 12.7  11.5 - 15.5 %   Platelets 260  150 - 400 K/uL  COMPREHENSIVE METABOLIC PANEL     Status: Abnormal   Collection Time    03/16/14  3:04 PM      Result Value Ref Range   Sodium 142  137 - 147 mEq/L   Potassium 3.7  3.7 - 5.3 mEq/L  Chloride 100  96 - 112 mEq/L   CO2 27  19 - 32 mEq/L   Glucose, Bld 128 (*) 70 - 99 mg/dL   BUN 9  6 - 23 mg/dL   Creatinine, Ser 0.75  0.50 - 1.35 mg/dL   Calcium 10.2  8.4 - 10.5 mg/dL   Total Protein 7.8  6.0 - 8.3 g/dL   Albumin 4.4  3.5 - 5.2 g/dL   AST 17  0 - 37 U/L   ALT 21  0 - 53 U/L   Alkaline Phosphatase 83  39 - 117 U/L   Total Bilirubin 0.3  0.3 - 1.2 mg/dL   GFR calc non Af Amer >90  >90 mL/min   GFR calc Af Amer >90  >90 mL/min   Comment: (NOTE)     The eGFR has been calculated using the CKD EPI equation.     This calculation has not been validated in all clinical situations.     eGFR's persistently <90 mL/min signify possible Chronic Kidney     Disease.  ETHANOL     Status: None   Collection Time    03/16/14  3:04 PM      Result Value Ref Range   Alcohol, Ethyl (B) <11  0 - 11 mg/dL   Comment:            LOWEST DETECTABLE LIMIT FOR     SERUM ALCOHOL IS 11 mg/dL     FOR MEDICAL PURPOSES ONLY  URINE RAPID DRUG SCREEN (HOSP PERFORMED)     Status: Abnormal   Collection Time    03/16/14  3:22  PM      Result Value Ref Range   Opiates POSITIVE (*) NONE DETECTED   Cocaine NONE DETECTED  NONE DETECTED   Benzodiazepines POSITIVE (*) NONE DETECTED   Amphetamines NONE DETECTED  NONE DETECTED   Tetrahydrocannabinol POSITIVE (*) NONE DETECTED   Barbiturates NONE DETECTED  NONE DETECTED   Comment:            DRUG SCREEN FOR MEDICAL PURPOSES     ONLY.  IF CONFIRMATION IS NEEDED     FOR ANY PURPOSE, NOTIFY LAB     WITHIN 5 DAYS.                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 Migraine    AXIS IV:  other psychosocial or environmental problems and problems related to social environment AXIS V:  41-50 serious symptoms  Treatment Plan/Recommendations:   Review of chart, vital signs, medications,  and notes.  1-Individual and group therapy  2-Medication management for depression and anxiety: Medications reviewed with the patient and she stated no untoward effects, unchanged. 3-Coping skills for depression, anxiety  4-Continue crisis stabilization and management  5-Address health issues--monitoring vital signs, stable  6-Treatment plan in progress to prevent relapse of depression and anxiety  Treatment Plan Summary: Daily contact with patient to assess and evaluate symptoms and progress in treatment Medication management  Current Medications:  Current Facility-Administered Medications  Medication Dose Route Frequency Provider Last  Rate Last Dose  . acetaminophen (TYLENOL) tablet 650 mg  650 mg Oral Q6H PRN Waylan Boga, NP      . alum & mag hydroxide-simeth (MAALOX/MYLANTA) 200-200-20 MG/5ML suspension 30 mL  30 mL Oral Q4H PRN Waylan Boga, NP      . carbamazepine (TEGRETOL XR) 12 hr tablet 200 mg  200 mg Oral BID Aleksey Newbern      . chlordiazePOXIDE (LIBRIUM) capsule 25 mg  25 mg Oral Q6H PRN Waylan Boga, NP      . chlordiazePOXIDE (LIBRIUM) capsule 25 mg  25 mg Oral QID Waylan Boga, NP   25 mg at 03/18/14 1141   Followed by  . [START ON 03/19/2014] chlordiazePOXIDE (LIBRIUM) capsule 25 mg  25 mg Oral TID Waylan Boga, NP       Followed by  . [START ON 03/20/2014] chlordiazePOXIDE (LIBRIUM) capsule 25 mg  25 mg Oral BH-qamhs Waylan Boga, NP       Followed by  . [START ON 03/21/2014] chlordiazePOXIDE (LIBRIUM) capsule 25 mg  25 mg Oral Daily Waylan Boga, NP      . cloNIDine (CATAPRES) tablet 0.1 mg  0.1 mg Oral QID Waylan Boga, NP   0.1 mg at 03/18/14 1141   Followed by  . [START ON 03/20/2014] cloNIDine (CATAPRES) tablet 0.1 mg  0.1 mg Oral BH-qamhs Waylan Boga, NP       Followed by  . [START ON 03/22/2014] cloNIDine (CATAPRES) tablet 0.1 mg  0.1 mg Oral QAC breakfast Waylan Boga, NP      . dicyclomine (BENTYL) tablet 20 mg  20 mg Oral Q6H PRN Waylan Boga, NP      . hydrOXYzine (ATARAX/VISTARIL) tablet 25 mg  25 mg Oral Q6H PRN Waylan Boga, NP   25 mg at 03/17/14 2211  . loperamide (IMODIUM) capsule 2-4 mg  2-4 mg Oral PRN Waylan Boga, NP      . magnesium hydroxide (MILK OF MAGNESIA) suspension 30 mL  30 mL Oral Daily PRN Waylan Boga, NP      . methocarbamol (ROBAXIN) tablet 500 mg  500 mg Oral Q8H PRN Waylan Boga, NP   500 mg at 03/17/14 2211  . naproxen (NAPROSYN) tablet 500 mg  500 mg Oral BID PRN Waylan Boga, NP      . ondansetron (ZOFRAN-ODT) disintegrating tablet 4 mg  4 mg Oral Q6H PRN Waylan Boga, NP   4 mg at 03/18/14 1143  . traZODone (DESYREL) tablet 50 mg  50 mg Oral QHS PRN Waylan Boga, NP   50  mg at 03/17/14 2327     Observation Level/Precautions:  15 minute checks  Laboratory:  Labs resulted, reviewed, and stable at this time.   Psychotherapy:  Group therapy, individual therapy, psychoeducation  Medications:  See MAR above  Consultations: None    Discharge Concerns: None    Estimated LOS: 5-7 days  Other:  N/A   I certify that inpatient services furnished can reasonably be expected to improve  the patient's condition.    Benjamine Mola, FNP-BC   5/2/20151:22 PM   Patient seen, evaluated and I agree with notes by Nurse Practitioner. Corena Pilgrim, MD

## 2014-03-19 MED ORDER — TRAZODONE HCL 150 MG PO TABS
75.0000 mg | ORAL_TABLET | Freq: Every evening | ORAL | Status: DC | PRN
  Administered 2014-03-19 – 2014-03-20 (×4): 75 mg via ORAL
  Filled 2014-03-19: qty 2
  Filled 2014-03-19: qty 1

## 2014-03-19 NOTE — [facility] Group Notes (Signed)
[facility] Group Notes:  (Clinical Social Work)  03/19/2014  10:00-11:00AM  Summary of Progress/Problems:   The main focus of today's process group was to   identify the patient's current support system and decide on other supports that can be put in place.  The picture on workbook was used to discuss why additional supports are needed.  An emphasis was placed on using counselor, doctor, therapy groups, 12-step groups, and problem-specific support groups to expand supports.   There was also an extensive discussion about what constitutes a healthy support versus an unhealthy support.  The patient expressed full comprehension of the concepts presented, and agreed that there is a need to add more supports, and to use the ones that are available.  The patient stated the current supports in place are extensive, but he had chosen to ignore them.  He stated that he used to go to a [employer], but felt shameful because of his drug use, so stopped going.  The unhealthy supports in his life are extensive, and include people his age and even cousins who come to his house, and have always been allowed to let themselves in.  They will bring marijuana and laughing gas, and he is unsure how to handle this.  When we did a mock confrontation, he was very soft spoken and unsure of his answer, and other group members pointed this out to him, while also asking if he actually does want to become sober.  Type of Therapy:  Process Group with Motivational Interviewing  Participation Level:  Active  Participation Quality:  Attentive and Sharing  Affect:  Anxious and Flat  Cognitive:  Appropriate  Insight:  Developing/Improving  Engagement in Therapy:  Engaged  Modes of Intervention:   Education, Support and Processing, Activity  Pilgrim's PrideMareida Grossman-Orr, LCSW 03/19/2014, 12:15pm

## 2014-03-19 NOTE — [facility] Group Notes (Signed)
[facility] Group Notes:  (Nursing/MHT/Case Management/Adjunct)  Date:  03/19/2014  Time:  3:21 PM  Type of Therapy: Psychoeducational Skills  Participation Level: Active  Participation Quality: Appropriate  Affect: Appropriate  Cognitive: Appropriate  Insight: Appropriate  Engagement in Group: Engaged  Modes of Intervention: Discussion  Summary of Progress/Problems: Pt did attend healthy coping skills group.   Chantil Bari Shanta Jonae Renshaw 03/19/2014, 3:21 PM

## 2014-03-19 NOTE — Progress Notes (Signed)
[facility] MD Progress Note  03/19/2014 5:57 PM Nicholas [name]  MRN:  [id number] Subjective:  Pt seen and chart viewed. Pt denies SI, HI, and AVH, contracts for safety. Pt continues to report fair sleep, but states that it is still improved from yesterday. Pt reports anxiety and depression both at 3/10 and feels "better". Pt reports that the medications are working well and that his withdrawal symptoms are minimal. Pt does report "feeling very spacy" today.   HPI: 23 year old white male requesting detox from opiates. Patient reports that he has been addicted to opiates for approximately 18 months. Patient reports his last usage of oxycodone was 30 mg and it was this morning. Patient reports that he uses 150-180mg  daily. Patient denies HI; however, when he does not use he "gets violent, tearing up stuff." Additionally, he also reports he wants to hurt himself or hurt others when he does not use opiates. Patient denies physical, sexual and emotional abuse. Patient reports that he has also used oxycodone and phenethanol as well as15 mg benzodiazepines, acid, and mushrooms in the past. Patient denies using opiates in high school. He also denies heroine or crack usage. Patients UDS was positive for opiates. He attempted detoxification on his own last year without success. Patient reports that he experiences a seizure Tuesday of last week when he attempted to detox on his own. Patient reports that he is experiencing withdrawal symptoms. Today, he told the MD that he tends to get violent when he withdrawals without medications to help him. He has been using opiates for the past year. When he stopped for eight days one time, he started having auditory and visual hallucinations. He does admit to having used hallucinogenics in his past.    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  Migraine     AXIS IV: other psychosocial or environmental problems and problems related to social environment  AXIS V: 41-50 serious symptoms   ADL's:  Intact  Sleep: Good  Appetite:  Good  Suicidal Ideation:  Denies Homicidal Ideation:  Denies AEB (as evidenced by):  Psychiatric Specialty Exam: Physical Exam  Review of Systems  Constitutional: Negative.   HENT: Negative.   Eyes: Negative.   Respiratory: Negative.   Cardiovascular: Negative.   Gastrointestinal: Negative.   Genitourinary: Negative.   Musculoskeletal: Negative.   Skin: Negative.   Neurological: Negative.   Endo/Heme/Allergies: Negative.   Psychiatric/Behavioral: Positive for depression and substance abuse. The patient is nervous/anxious and has insomnia.     Blood pressure 141/85, pulse 105, temperature 98 F (36.7 C), temperature source Oral, resp. rate 21, height 6\' 2"  (1.88 m), weight 94.348 kg (208 lb), SpO2 98.00%.Body mass index is 26.69 kg/(m^2).   General Appearance: Casual  Eye Contact::  Good  Speech:  Clear and Coherent  Volume:  Normal  Mood:  Anxious and Depressed  Affect:  Appropriate  Thought Process:  Circumstantial  Orientation:  Full (Time, Place, and Person)  Thought Content:  WDL  Suicidal Thoughts:  No  Homicidal Thoughts:  No  Memory:  Immediate;   Good Recent;   Good Remote;   Good  Judgement:  Fair  Insight:  Good  Psychomotor Activity:  Normal  Concentration:  Good  Recall:  Good  Fund of Knowledge:Good  Language: Good  Akathisia:  NA  Handed:  Right  AIMS (if indicated):     Assets:  Communication Skills Desire for Improvement Resilience  Sleep:  Number of Hours: 4.75    Musculoskeletal: Strength & Muscle Tone: within normal limits Gait & Station: normal Patient leans: N/A  Current Medications: Current Facility-Administered Medications   Medication Dose Route Frequency Provider Last Rate Last Dose  . acetaminophen (TYLENOL) tablet 650 mg  650 mg Oral Q6H PRN Nanine MeansJamison Lord, NP      . alum & mag hydroxide-simeth (MAALOX/MYLANTA) 200-200-20 MG/5ML suspension 30 mL  30 mL Oral Q4H PRN Nanine MeansJamison Lord, NP      . carbamazepine (TEGRETOL XR) 12 hr tablet 200 mg  200 mg Oral BID Antonin Meininger   200 mg at 03/19/14 1719  . chlordiazePOXIDE (LIBRIUM) capsule 25 mg  25 mg Oral Q6H PRN Nanine MeansJamison Lord, NP      . Melene Muller[START ON 03/20/2014] chlordiazePOXIDE (LIBRIUM) capsule 25 mg  25 mg Oral BH-qamhs Nanine MeansJamison Lord, NP       Followed by  . [START ON 03/21/2014] chlordiazePOXIDE (LIBRIUM) capsule 25 mg  25 mg Oral Daily Nanine MeansJamison Lord, NP      . cloNIDine (CATAPRES) tablet 0.1 mg  0.1 mg Oral QID Nanine MeansJamison Lord, NP   0.1 mg at 03/19/14 1719   Followed by  . [START ON 03/20/2014] cloNIDine (CATAPRES) tablet 0.1 mg  0.1 mg Oral BH-qamhs Nanine MeansJamison Lord, NP       Followed by  . [START ON 03/22/2014] cloNIDine (CATAPRES) tablet 0.1 mg  0.1 mg Oral QAC breakfast Nanine MeansJamison Lord, NP      . dicyclomine (BENTYL) tablet 20 mg  20 mg Oral Q6H PRN Nanine MeansJamison Lord, NP      . hydrOXYzine (ATARAX/VISTARIL) tablet 25 mg  25 mg Oral Q6H PRN Nanine MeansJamison Lord, NP   25 mg at 03/18/14 2305  . loperamide (IMODIUM) capsule 2-4 mg  2-4 mg Oral PRN Nanine MeansJamison Lord, NP      . magnesium hydroxide (MILK OF MAGNESIA) suspension 30 mL  30 mL Oral Daily PRN Nanine MeansJamison Lord, NP      . methocarbamol (ROBAXIN) tablet 500 mg  500 mg Oral Q8H PRN Nanine MeansJamison Lord, NP   500 mg at 03/18/14 2303  . naproxen (NAPROSYN) tablet 500 mg  500 mg Oral BID PRN Nanine MeansJamison Lord, NP      . ondansetron (ZOFRAN-ODT) disintegrating tablet 4 mg  4 mg Oral Q6H PRN Nanine MeansJamison Lord, NP   4 mg at 03/18/14 1143  . traZODone (DESYREL) tablet 75 mg  75 mg Oral QHS PRN,MR X 1 Beau FannyJohn C Withrow, FNP        Lab Results: No results found for this or any previous visit (from the past 48 hour(s)).  Physical Findings: AIMS: Facial and Oral  Movements Muscles of Facial Expression: None, normal Lips and Perioral Area: None, normal Jaw: None, normal Tongue: None, normal,Extremity Movements Upper (arms, wrists, hands, fingers): None, normal Lower (legs, knees, ankles, toes): None, normal, Trunk Movements Neck, shoulders, hips: None, normal, Overall Severity Severity of abnormal movements (highest score from questions above): None, normal Incapacitation due to abnormal movements: None, normal Patient's awareness of abnormal movements (rate only patient's report): No Awareness, Dental Status Current problems with teeth and/or dentures?: No Does patient usually wear dentures?: No  CIWA:  CIWA-Ar Total: 1 COWS:  COWS Total Score: 4  Treatment Plan Summary: Daily contact with patient to assess and evaluate symptoms  and progress in treatment Medication management  Plan: Review of chart, vital signs, medications, and notes.  1-Individual and group therapy  2-Medication management for depression and anxiety: Medications reviewed with the patient and she stated no untoward effects, unchanged. 3-Coping skills for depression, anxiety  4-Continue crisis stabilization and management  5-Address health issues--monitoring vital signs, stable  6-Treatment plan in progress to prevent relapse of depression and anxiety  Medical Decision Making Problem Points:  Established problem, stable/improving (1), Review of last therapy session (1) and Review of psycho-social stressors (1) Data Points:  Review or order clinical lab tests (1) Review or order medicine tests (1) Review of medication regiment & side effects (2) Review of new medications or change in dosage (2)  I certify that inpatient services furnished can reasonably be expected to improve the patient's condition.   Beau FannyJohn C Withrow, FNP-BC 03/19/2014, 5:57 PM  Patient seen, evaluated and I agree with notes by Nurse Practitioner. Thedore MinsMojeed Toniette Devera, MD

## 2014-03-19 NOTE — [facility] Group Notes (Signed)
[facility] Group Notes:  (Nursing/MHT/Case Management/Adjunct)  Date:  03/19/2014  Time:  11:39 AM  Type of Therapy:  Psychoeducational Skills  Participation Level:  Active  Participation Quality:  Appropriate  Affect:  Appropriate  Cognitive:  Appropriate  Insight:  Appropriate  Engagement in Group:  Engaged  Modes of Intervention:  Discussion  Summary of Progress/Problems: Pt did attend self inventory group, pt reported that he was negative SI/HI, no AH/VH noted. Pt rated his depression as a 1, and his helplessness/hopelessness as a 2.     Nicholas [name] Shanta Luz Burcher 03/19/2014, 11:39 AM

## 2014-03-19 NOTE — Progress Notes (Signed)
Patient ID: Nicholas [name], male   DOB: 12/14/1990, 23 y.o.   MRN: [id number] He has been in group today and has had interaction with peers and staff. He talks about having dreams  Like he is awake then he will wake up. C/o being cold all the time. Self inventory: depression 2, hopelessness 3, withdrawals of agitation, chilling, and craving. She denies SI thoughts.

## 2014-03-19 NOTE — Progress Notes (Signed)
Pt has been playing cards with peers all evening. At 2300, pt requesting something for sleep, anxiety as well as reports muscle spasms in his legs bilat. Rates the discomfort a 4/10. Medicated per his requests along with scheduled librium and clonidine. CIWA is a "5" and COWS "4", VSS. On reassess, pt is asleep. Denies SI/HI/AVH and remains safe. Nicholas [name]

## 2014-03-20 DIAGNOSIS — F39 Unspecified mood [affective] disorder: Secondary | ICD-10-CM

## 2014-03-20 DIAGNOSIS — F1920 Other psychoactive substance dependence, uncomplicated: Principal | ICD-10-CM

## 2014-03-20 NOTE — Progress Notes (Signed)
Pt up and visible on unit. Reports doing well and feeling "ok." Denies any and all withdrawal this evening. VSS. No complaints of pain. Pt flat but appropriate. Mood stable. Guarded and forwards little information. Does request trazadone for sleep which is given without difficulty. Pt supported, encouraged. Denies SI/HI/AVH and remains safe. Levell JulyMarian Eakes Zachery ConchFriedman

## 2014-03-20 NOTE — Tx Team (Signed)
Interdisciplinary Treatment Plan Update (Adult)  Date: 03/20/2014   Time Reviewed: 11:07 AM  Progress in Treatment:  Attending groups: Yes  Participating in groups:  Yes  Taking medication as prescribed: Yes  Tolerating medication: Yes  Family/Significant othe contact made: Not yet. SPE required for this pt.   Patient understands diagnosis: Yes, AEB seeking treatment for opiate detox, passive SI, and mood stabilization.  Discussing patient identified problems/goals with staff: Yes  Medical problems stabilized or resolved: Yes  Denies suicidal/homicidal ideation: Yes during group/self report.  Patient has not harmed self or Others: Yes  New problem(s) identified:  Discharge Plan or Barriers: Pt hoping for [facility] referral. [place] Only option [place] [facility]. (no insurance). CSW assessing.  Additional comments:  23 year old white male requesting detox from opiates. Patient reports that he has been addicted to opiates for approximately 18 months. Patient reports his last usage of oxycodone was 30 mg and it was this morning. Patient reports that he uses 150-180mg  daily. Patient denies HI; however, when he does not use he "gets violent, tearing up stuff." Additionally, he also reports he wants to hurt himself or hurt others when he does not use opiates. Patient denies physical, sexual and emotional abuse. Patient reports that he has also used oxycodone and phenethanol as well as15 mg benzodiazepines, acid, and mushrooms in the past. Patient denies using opiates in high school. He also denies heroine or crack usage. Patients UDS was positive for opiates. He attempted detoxification on his own last year without success. Patient reports that he experiences a seizure Tuesday of last week when he attempted to detox on his own. Patient reports that he is experiencing withdrawal symptoms. Today, he told the MD that he tends to get violent when he withdrawals without  medications to help him. He has been using opiates for the past year. When he stopped for eight days one time, he started having auditory and visual hallucinations. He does admit to having used hallucinogenics in his past. Reason for Continuation of Hospitalization: Librium taper/clonidine taper-withdrawals Mood stabilization Medication management  Estimated length of stay: 3-5 days  For review of initial/current patient goals, please see plan of care.  Attendees:  Patient:    Family:    Physician: Geoffery LyonsIrving Lugo MD 03/20/2014 11:07 AM   Nursing: Vanessa KickJan RN 03/20/2014 11:07 AM   Clinical Social Worker Josilyn Shippee Smart, LCSWA  03/20/2014 11:07 AM   Other: Victorino DikeJennifer RN  03/20/2014 11:07 AM   Other: Chandra BatchAggie N. PA 03/20/2014 11:09 AM   Other: Darden DatesJennifer C. Nurse CM  03/20/2014 11:07 AM   Other:    Scribe for Treatment Team:  Trula SladeHeather Smart LCSWA 03/20/2014 11:07 AM

## 2014-03-20 NOTE — Progress Notes (Signed)
[facility]  03/20/2014 4:13 PM Roney MarionRyan E Grizzle  MRN:  [id number] Subjective:  Alycia RossettiRyan continues to be detox. He is experiencing more mood instability agitation, insomnia.  Admits he is scared. He almost died this time around. He is concerned about how to handle himself when he is out there given how easily available are these drugs. He has been having some cravings. He has not been successful in the past on getting off these drugs as his mood changes and he has gotten aggressive. He states he had a seizure coming off on his own. He is currently on Tegretol. 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  HENT: Negative.   Eyes: Negative.   Respiratory: Negative.   Cardiovascular: Negative.   Gastrointestinal: Negative.   Genitourinary: Negative.   Musculoskeletal: Positive for back pain and myalgias.       Muscle spasms  Skin: Negative.   Neurological: Positive for weakness.  Endo/Heme/Allergies: Negative.   Psychiatric/Behavioral: Positive for depression and substance abuse. The patient is nervous/anxious and has insomnia.     Blood pressure 138/72, pulse 98, temperature 97.6 F (36.4 C), temperature source Oral, resp. rate 18, height 6\' 2"  (1.88 m),  weight 94.348 kg (208 lb), SpO2 98.00%.Body mass index is 26.69 kg/(m^2).  General Appearance: Fairly Groomed  Patent attorneyye Contact::  Fair  Speech:  Clear and Coherent and not spontanoues  Volume:  Decreased  Mood:  Depressed and worried  Affect:  Restricted  Thought Process:  Coherent and Goal Directed  Orientation:  Full (Time, Place, and Person)  Thought Content:  symtpoms, worries, concerns  Suicidal Thoughts:  No  Homicidal Thoughts:  No  Memory:  Immediate;   Fair Recent;   Fair Remote;   Fair  Judgement:  Fair  Insight:  Present  Psychomotor Activity:  Restlessness  Concentration:  Fair  Recall:  FiservFair  Fund of Knowledge:NA  Language: Fair  Akathisia:  No  Handed:    AIMS (if indicated):     Assets:  Desire for Improvement Housing  Sleep:  Number of Hours: 4.75   Musculoskeletal: Strength & Muscle Tone: within normal limits Gait & Station: normal Patient leans: N/A  Current Medications: Current Facility-Administered Medications  Medication Dose Route Frequency Provider Last Rate Last Dose  . acetaminophen (TYLENOL) tablet 650 mg  650 mg Oral Q6H PRN Nanine MeansJamison Lord, NP   650 mg at 03/20/14 0945  . alum & mag hydroxide-simeth (MAALOX/MYLANTA) 200-200-20 MG/5ML suspension 30 mL  30 mL Oral Q4H PRN Nanine MeansJamison Lord, NP      . carbamazepine (TEGRETOL XR) 12 hr tablet 200 mg  200 mg Oral BID Mojeed Akintayo   200 mg at 03/20/14 0840  . chlordiazePOXIDE (LIBRIUM) capsule 25 mg  25 mg Oral BH-qamhs Nanine MeansJamison Lord, NP   25 mg at 03/20/14 0840  Followed by  . [START ON 03/21/2014] chlordiazePOXIDE (LIBRIUM) capsule 25 mg  25 mg Oral Daily Nanine MeansJamison Lord, NP      . cloNIDine (CATAPRES) tablet 0.1 mg  0.1 mg Oral BH-qamhs Nanine MeansJamison Lord, NP   0.1 mg at 03/20/14 0840   Followed by  . [START ON 03/22/2014] cloNIDine (CATAPRES) tablet 0.1 mg  0.1 mg Oral QAC breakfast Nanine MeansJamison Lord, NP      . dicyclomine (BENTYL) tablet 20 mg  20 mg Oral Q6H PRN Nanine MeansJamison Lord, NP      . hydrOXYzine (ATARAX/VISTARIL)  tablet 25 mg  25 mg Oral Q6H PRN Nanine MeansJamison Lord, NP   25 mg at 03/18/14 2305  . loperamide (IMODIUM) capsule 2-4 mg  2-4 mg Oral PRN Nanine MeansJamison Lord, NP      . magnesium hydroxide (MILK OF MAGNESIA) suspension 30 mL  30 mL Oral Daily PRN Nanine MeansJamison Lord, NP      . methocarbamol (ROBAXIN) tablet 500 mg  500 mg Oral Q8H PRN Nanine MeansJamison Lord, NP   500 mg at 03/20/14 1413  . naproxen (NAPROSYN) tablet 500 mg  500 mg Oral BID PRN Nanine MeansJamison Lord, NP   500 mg at 03/20/14 1413  . ondansetron (ZOFRAN-ODT) disintegrating tablet 4 mg  4 mg Oral Q6H PRN Nanine MeansJamison Lord, NP   4 mg at 03/18/14 1143  . traZODone (DESYREL) tablet 75 mg  75 mg Oral QHS PRN,MR X 1 Beau FannyJohn C Withrow, FNP   75 mg at 03/20/14 0025    Lab Results: No results found for this or any previous visit (from the past 48 hour(s)).  Physical Findings: AIMS: Facial and Oral Movements Muscles of Facial Expression: None, normal Lips and Perioral Area: None, normal Jaw: None, normal Tongue: None, normal,Extremity Movements Upper (arms, wrists, hands, fingers): None, normal Lower (legs, knees, ankles, toes): None, normal, Trunk Movements Neck, shoulders, hips: None, normal, Overall Severity Severity of abnormal movements (highest score from questions above): None, normal Incapacitation due to abnormal movements: None, normal Patient's awareness of abnormal movements (rate only patient's report): No Awareness, Dental Status Current problems with teeth and/or dentures?: No Does patient usually wear dentures?: No  CIWA:  CIWA-Ar Total: 2 COWS:  COWS Total Score: 0  Treatment Plan Summary: Daily contact with patient to assess and evaluate symptoms and progress in treatment Medication management  Plan: Supportive approach/coping skills/relapse prevention           Continue Detox/Tegretol           Continue to reassess co morbidities            Explore rehab options Medical Decision Making Problem Points:  Review of psycho-social stressors (1) Data Points:   Review of medication regiment & side effects (2)  I certify that inpatient services furnished can reasonably be expected to improve the patient's condition.   Rachael Feerving A Ashe Gago 03/20/2014, 4:13 PM

## 2014-03-20 NOTE — Progress Notes (Signed)
Adult Psychoeducational Group Note  Date:  03/20/2014 Time:  10:00 am  Group Topic/Focus:  Wellness Toolbox:   The focus of this group is to discuss various aspects of wellness, balancing those aspects and exploring ways to increase the ability to experience wellness.  Patients will create a wellness toolbox for use upon discharge.  Participation Level:  Active  Participation Quality:  Appropriate, Sharing and Supportive  Affect:  Appropriate  Cognitive:  Appropriate  Insight: Appropriate  Engagement in Group:  Engaged  Modes of Intervention:  Discussion, Education, Socialization and Support  Additional Comments:  Pt stated that by going back to school and getting rid of bad influences in his life that he can promote health and wellness. Pt stated that marijuana and prescription pills are barriers to his progress.   Lateasha Breuer 03/20/2014, 10:58 AM

## 2014-03-20 NOTE — Progress Notes (Signed)
Patient ID: Nicholas MarionRyan E [name], male   DOB: 12/02/1990, 23 y.o.   MRN: [id number]  D: Patient has a flat affect on approach but pleasant. Reports depression "2" and feelings of hopelessness "4". Currently denies any SI/HI. Reports eating and drinking ok. Reports it has been hard not smoking marijuana everyday.  A: Staff will monitor on q 15 minute checks, follow treatment plan, and give medications as needed. R: Cooperative and taking medications as ordered.

## 2014-03-20 NOTE — [facility] Group Notes (Signed)
[facility] LCSW Group Therapy  03/20/2014 3:16 PM  Type of Therapy:  Group Therapy  Participation Level:  Active  Participation Quality:  Attentive  Affect:  Flat  Cognitive:  Alert and Oriented  Insight:  Improving  Engagement in Therapy:  Improving  Modes of Intervention:  Confrontation, Discussion, Education, Exploration, Problem-solving, Rapport Building, Socialization and Support  Summary of Progress/Problems: Today's Topic: Overcoming Obstacles. Pt identified obstacles faced currently and processed barriers involved in overcoming these obstacles. Pt identified steps necessary for overcoming these obstacles and explored motivation (internal and external) for facing these difficulties head on. Pt further identified one area of concern in their lives and chose a skill of focus pulled from their "toolbox." Nicholas [name] was attentive and engaged throughout today's therapy group. [name] asked multiple questions about [facility], stating that he was nervous about going to a treatment center. Other pts [facility] and offered advice to make the most out of treatment at this facility. Nicholas [name] was accepting and open to this information and shows progress in the group setting.    [name] Smart LCSWA  03/20/2014, 3:16 PM

## 2014-03-20 NOTE — Progress Notes (Signed)
Pt observed in the dayroom watching TV and talking with peers.  Pt reports he is doing ok, but is still anxious about his recovery process.  He says he has had trouble in the past trying to detox.  He really wants to get clean and maybe go to school.  Pt is appropriate on the unit and reports he has been going to groups and participating.  He says [facility]  Pt denies SI/HI/AV.  Support and encouragement offered.  Pt makes his needs known to staff.  Safety maintained with q15 minute checks.

## 2014-03-20 NOTE — [facility] Group Notes (Signed)
[facility]   03/20/2014 9:55 AM  Participation Quality:  Appropriate   Mood/Affect:  Appropriate  Depression Rating:  2  Anxiety Rating:  2  Thoughts of Suicide:  No Will you contract for safety?   NA  Current AVH:  No  Plan for Discharge/Comments:  Pt reports that his mom took him to hospital after he accidentally took too much phenethanol. Pt reports a year in a half of opiate abuse and marijuana use since age 23. Pt reports mild withdrawals, "I feel like a zombie today." Pt hoping for [facility] referral "[place]   [employer] Means: mother   Supports: mother/family supports   Berkshire HathawayHeather Smart LCSWA

## 2014-03-21 MED ORDER — TRAZODONE HCL 100 MG PO TABS
100.0000 mg | ORAL_TABLET | Freq: Every evening | ORAL | Status: DC | PRN
  Administered 2014-03-21 (×2): 100 mg via ORAL
  Filled 2014-03-21 (×2): qty 1

## 2014-03-21 NOTE — Progress Notes (Signed)
Recreation Therapy Notes  Animal-Assisted Activity/Therapy (AAA/T) Program Checklist/Progress Notes Patient Eligibility Criteria Checklist & Daily Group note for Rec Tx Intervention  Date: 05.05.2015 Time: 2:45pm Location: [address] Dayroom    AAA/T Program Assumption of Risk Form signed by Patient/ or Parent Legal Guardian yes  Patient is free of allergies or sever asthma yes  Patient reports no fear of animals yes  Patient reports no history of cruelty to animals yes   Patient understands his/her participation is voluntary yes  Behavioral Response: DID NOT ATTEND  Janelis Stelzer L Doroteo Nickolson, LRT/CTRS  Nakyia Dau L Jamelah Sitzer 03/21/2014 5:02 PM 

## 2014-03-21 NOTE — Progress Notes (Signed)
Patient ID: Nicholas MarionRyan [name], male   DOB: 09/15/1991, 23 y.o.   MRN: [id number] D: Patient has been up in the milieu interacting with staff and others.  Patient attended group and talked off his plans when he returns home.  Patient is concerned because he has a "stash of pot and pills."  He would like to have his mother get rid off it.  Informed patient that I would get his cell phone from his locker so he could call her.  He reports withdrawal symptoms such as tremors, chilling and agitation.  He denies any SI/HI/AVH.  He was supportive of others in group today.  [facility].  A: Continue to monitor medication management and MD orders.  Safety checks completed every 15 minutes per protocol.  R: Patient is receptive to staff; his behavior is appropriate for situation.

## 2014-03-21 NOTE — Progress Notes (Signed)
[facility]  03/21/2014 2:27 PM Nicholas [name]  MRN:  [id number] Subjective:  Nicholas [name] continues to work on self. He states he is not going back to school this summer. 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 He is now concerned as he thinks he needs to go to a long term program. He states he has bee smoking pot since he was 15 un interrupted, high amounts. He has also been using all sort of drugs.(states he has taken up to 22 mg of Xanax a day ?)  Does not think that couple of weeks in [facility] is going to do it for him. Admits to worry about relapsing. Wants to mend his ways..  Diagnosis:   DSM5: Schizophrenia Disorders:  none Obsessive-Compulsive Disorders:  none Trauma-Stressor Disorders:  none Substance/Addictive Disorders:  Cannabis Use Disorder - Severe (304.30) and Opioid Disorder - Severe (304.00), Benzodiazepine Use disorder Depressive Disorders:  Major Depressive Disorder - Mild (296.21) Total Time spent with patient: 30 minutes  Axis I: Substance Induced Mood Disorder  ADL's:  Intact  Sleep: Fair  Appetite:  Fair  Suicidal Ideation:  Plan:  denies Intent:  denies Means:  denies Homicidal Ideation:  Plan:  denies Intent:  denies Means:  denies AEB (as evidenced by):  Psychiatric Specialty Exam: Physical Exam  Review of Systems  Constitutional: Negative.   HENT: Negative.   Eyes: Negative.   Respiratory: Negative.   Cardiovascular: Negative.   Gastrointestinal: Negative.   Genitourinary: Negative.   Musculoskeletal: Positive for joint pain.  Skin: Negative.   Neurological: Negative.   Endo/Heme/Allergies: Negative.   Psychiatric/Behavioral: Positive for substance abuse. The patient is nervous/anxious and has insomnia.     Blood pressure 132/80, pulse 86, temperature 97.5 F (36.4 C), temperature source Oral, resp. rate 18, height 6\' 2"  (1.88 m), weight 94.348 kg (208 lb), SpO2 98.00%.Body mass index is 26.69 kg/(m^2).  General Appearance:  Fairly Groomed  Patent attorneyye Contact::  Fair  Speech:  Clear and Coherent  Volume:  Decreased  Mood:  Anxious and worried  Affect:  anxious, worried  Thought Process:  Coherent and Goal Directed  Orientation:  Full (Time, Place, and Person)  Thought Content:  symtpoms, worries, concerns  Suicidal Thoughts:  No  Homicidal Thoughts:  No  Memory:  Immediate;   Fair Recent;   Fair Remote;   Fair  Judgement:  Fair  Insight:  Present  Psychomotor Activity:  Normal  Concentration:  Fair  Recall:  FiservFair  Fund of Knowledge:NA  Language: Fair  Akathisia:  No  Handed:    AIMS (if indicated):     Assets:  Desire for Improvement  Sleep:  Number of Hours: 4.75   Musculoskeletal: Strength & Muscle Tone: within normal limits Gait & Station: normal Patient leans: N/A  Current Medications: Current Facility-Administered Medications  Medication Dose Route Frequency Provider Last Rate Last Dose  . acetaminophen (TYLENOL) tablet 650 mg  650 mg Oral Q6H PRN Nanine MeansJamison Lord, NP   650 mg at 03/21/14 0038  . alum & mag hydroxide-simeth (MAALOX/MYLANTA) 200-200-20 MG/5ML suspension 30 mL  30 mL Oral Q4H PRN Nanine MeansJamison Lord, NP      . carbamazepine (TEGRETOL XR) 12 hr tablet 200 mg  200 mg Oral BID Mojeed Akintayo   200 mg at 03/21/14 0800  . cloNIDine (CATAPRES) tablet 0.1 mg  0.1 mg Oral BH-qamhs Nanine MeansJamison Lord, NP   0.1 mg at 03/21/14 0800   Followed by  . [START ON 03/22/2014] cloNIDine (  CATAPRES) tablet 0.1 mg  0.1 mg Oral QAC breakfast Nanine MeansJamison Lord, NP      . dicyclomine (BENTYL) tablet 20 mg  20 mg Oral Q6H PRN Nanine MeansJamison Lord, NP      . hydrOXYzine (ATARAX/VISTARIL) tablet 25 mg  25 mg Oral Q6H PRN Nanine MeansJamison Lord, NP   25 mg at 03/18/14 2305  . loperamide (IMODIUM) capsule 2-4 mg  2-4 mg Oral PRN Nanine MeansJamison Lord, NP      . magnesium hydroxide (MILK OF MAGNESIA) suspension 30 mL  30 mL Oral Daily PRN Nanine MeansJamison Lord, NP      . methocarbamol (ROBAXIN) tablet 500 mg  500 mg Oral Q8H PRN Nanine MeansJamison Lord, NP   500 mg at 03/20/14  2240  . naproxen (NAPROSYN) tablet 500 mg  500 mg Oral BID PRN Nanine MeansJamison Lord, NP   500 mg at 03/20/14 1413  . ondansetron (ZOFRAN-ODT) disintegrating tablet 4 mg  4 mg Oral Q6H PRN Nanine MeansJamison Lord, NP   4 mg at 03/18/14 1143  . traZODone (DESYREL) tablet 75 mg  75 mg Oral QHS PRN,MR X 1 Beau FannyJohn C Withrow, FNP   75 mg at 03/20/14 2331    Lab Results: No results found for this or any previous visit (from the past 48 hour(s)).  Physical Findings: AIMS: Facial and Oral Movements Muscles of Facial Expression: None, normal Lips and Perioral Area: None, normal Jaw: None, normal Tongue: None, normal,Extremity Movements Upper (arms, wrists, hands, fingers): None, normal Lower (legs, knees, ankles, toes): None, normal, Trunk Movements Neck, shoulders, hips: None, normal, Overall Severity Severity of abnormal movements (highest score from questions above): None, normal Incapacitation due to abnormal movements: None, normal Patient's awareness of abnormal movements (rate only patient's report): No Awareness, Dental Status Current problems with teeth and/or dentures?: No Does patient usually wear dentures?: No  CIWA:  CIWA-Ar Total: 2 COWS:  COWS Total Score: 0  Treatment Plan Summary: Daily contact with patient to assess and evaluate symptoms and progress in treatment Medication management  Plan: Supportive approach/coping skills/relapse prevention           Will pursue detox           Address the co morbidities   Medical Decision Making Problem Points:  Review of psycho-social stressors (1) Data Points:  Review of medication regiment & side effects (2) Review of new medications or change in dosage (2)  I certify that inpatient services furnished can reasonably be expected to improve the patient's condition.   Rachael FeeIrving A Tinzley Dalia 03/21/2014, 2:27 PM

## 2014-03-21 NOTE — [facility] Group Notes (Signed)
[facility] LCSW Group Therapy  03/21/2014 3:31 PM  Type of Therapy:  Group Therapy  Participation Level:  Active  Participation Quality:  Attentive  Affect:  Appropriate  Cognitive:  Alert  Insight:  Improving  Engagement in Therapy:  Improving  Modes of Intervention:  Confrontation, Discussion, Education, Exploration, Problem-solving, Rapport Building, Socialization and Support  Summary of Progress/Problems: [facility] Speaker came to talk about his personal journey with substance abuse and addiction. Nicholas [name]  processed ways by which to relate to the speaker. MHA speaker provided handouts and educational information pertaining to groups and services offered by the [facility].    Nicholas [name] LCSWA  03/21/2014, 3:31 PM

## 2014-03-21 NOTE — Progress Notes (Signed)
Pt sitting in the dayroom watching TV.  Pt reports he is doing ok this evening.  He denies any withdrawal symptoms at this time.  He denies SI/HI/AV.  Pt reports he is looking at going for long term rehab, but is still unsure at this time.  Pt says he has gone to groups today and participated in unit activities.  Pt makes his needs known to staff and voices no needs or concerns at this time.  Support and encouragement offered.  Safety maintained with q15 minute checks.

## 2014-03-21 NOTE — Progress Notes (Signed)
Adult Psychoeducational Group Note  Date:  03/21/2014 Time:  9:39 PM  Group Topic/Focus:  Wrap-Up Group:   The focus of this group is to help patients review their daily goal of treatment and discuss progress on daily workbooks.  Participation Level:  Active  Participation Quality:  Appropriate  Affect:  Appropriate  Cognitive:  Appropriate  Insight: Appropriate  Engagement in Group:  Engaged  Modes of Intervention:  Discussion  Additional Comments:  Pt was present for wrap up group. He said that he has had a good day because the worst of his detoxing is over. He said that while he was watching tv today he heard someone say, "what's worse than to lie; to live a lie" He said that this is the best place he could hear that. [facility] and he is looking forward to that. He was cooperative and interacted in group.   Kimberlee Shoun A Eartha Vonbehren 03/21/2014, 9:39 PM

## 2014-03-22 MED ORDER — DOXEPIN HCL 25 MG PO CAPS
25.0000 mg | ORAL_CAPSULE | Freq: Every evening | ORAL | Status: DC | PRN
  Filled 2014-03-22: qty 28

## 2014-03-22 MED ORDER — DOXEPIN HCL 25 MG PO CAPS
25.0000 mg | ORAL_CAPSULE | Freq: Every evening | ORAL | Status: DC | PRN
  Administered 2014-03-22 (×2): 25 mg via ORAL
  Filled 2014-03-22 (×2): qty 1

## 2014-03-22 NOTE — Progress Notes (Signed)
[facility]  03/22/2014 5:48 PM Roney MarionRyan E Koci  MRN:  [id number] Subjective:  Still not sleeping well. Got nightmares form the Trazodone. Asked for an alternative to Trazodone. Continues to express his committment to sobriety and to abandon his practices of getting money. Still concerned about relapsing given the length of time he has been using drugs Diagnosis:   DSM5: Schizophrenia Disorders:  none Obsessive-Compulsive Disorders:  none Trauma-Stressor Disorders:  none Substance/Addictive Disorders:  Cannabis Use Disorder - Severe (304.30) and Opioid Disorder - Severe (304.00), Benzodiazepine use disorder Depressive Disorders:  Major Depressive Disorder - Moderate (296.22) Total Time spent with patient: 30 minutes  Axis I: Substance Induced Mood Disorder  ADL's:  Intact  Sleep: Poor  Appetite:  Fair  Suicidal Ideation:  Plan:  denies Intent:  denies Means:  denies Homicidal Ideation:  Plan:  denies Intent:  denies Means:  denies AEB (as evidenced by):  Psychiatric Specialty Exam: Physical Exam  Review of Systems  Constitutional: Negative.   HENT: Negative.   Eyes: Negative.   Respiratory: Negative.   Cardiovascular: Negative.   Gastrointestinal: Negative.   Genitourinary: Negative.   Musculoskeletal: Negative.   Skin: Negative.   Neurological: Negative.   Endo/Heme/Allergies: Negative.   Psychiatric/Behavioral: Positive for substance abuse. The patient is nervous/anxious and has insomnia.     Blood pressure 145/96, pulse 84, temperature 97.2 F (36.2 C), temperature source Oral, resp. rate 18, height 6\' 2"  (1.88 m), weight 94.348 kg (208 lb), SpO2 100.00%.Body mass index is 26.69 kg/(m^2).  General Appearance: Fairly Groomed  Patent attorneyye Contact::  Fair  Speech:  Clear and Coherent and not spontaneous  Volume:  Decreased  Mood:  Depressed and worried  Affect:  Restricted  Thought Process:  Coherent and Goal Directed  Orientation:  Full (Time, Place, and  Person)  Thought Content:  symtpoms, worries, concerns  Suicidal Thoughts:  No  Homicidal Thoughts:  No  Memory:  Immediate;   Fair Recent;   Fair Remote;   Fair  Judgement:  Fair  Insight:  Present  Psychomotor Activity:  Decreased  Concentration:  Fair  Recall:  FiservFair  Fund of Knowledge:NA  Language: Fair  Akathisia:  No  Handed:    AIMS (if indicated):     Assets:  Desire for Improvement Social Support  Sleep:  Number of Hours: 5.5   Musculoskeletal: Strength & Muscle Tone: within normal limits Gait & Station: normal Patient leans: N/A  Current Medications: Current Facility-Administered Medications  Medication Dose Route Frequency Provider Last Rate Last Dose  . acetaminophen (TYLENOL) tablet 650 mg  650 mg Oral Q6H PRN Nanine MeansJamison Lord, NP   650 mg at 03/21/14 0038  . alum & mag hydroxide-simeth (MAALOX/MYLANTA) 200-200-20 MG/5ML suspension 30 mL  30 mL Oral Q4H PRN Nanine MeansJamison Lord, NP      . carbamazepine (TEGRETOL XR) 12 hr tablet 200 mg  200 mg Oral BID Mojeed Akintayo   200 mg at 03/22/14 1655  . cloNIDine (CATAPRES) tablet 0.1 mg  0.1 mg Oral QAC breakfast Nanine MeansJamison Lord, NP   0.1 mg at 03/22/14 [id number]  . doxepin (SINEQUAN) capsule 25 mg  25 mg Oral QHS PRN Rachael FeeIrving A Hazelgrace Bonham, MD      . magnesium hydroxide (MILK OF MAGNESIA) suspension 30 mL  30 mL Oral Daily PRN Nanine MeansJamison Lord, NP        Lab Results: No results found for this or any previous visit (from the past 48 hour(s)).  Physical Findings: AIMS: Facial and Oral  Movements Muscles of Facial Expression: None, normal Lips and Perioral Area: None, normal Jaw: None, normal Tongue: None, normal,Extremity Movements Upper (arms, wrists, hands, fingers): None, normal Lower (legs, knees, ankles, toes): None, normal, Trunk Movements Neck, shoulders, hips: None, normal, Overall Severity Severity of abnormal movements (highest score from questions above): None, normal Incapacitation due to abnormal movements: None, normal Patient's  awareness of abnormal movements (rate only patient's report): No Awareness, Dental Status Current problems with teeth and/or dentures?: No Does patient usually wear dentures?: No  CIWA:  CIWA-Ar Total: 5 COWS:  COWS Total Score: 0  Treatment Plan Summary: Daily contact with patient to assess and evaluate symptoms and progress in treatment Medication management  Plan: Supportive approach/coping skills/relapse prevention           D/C Trazodone           Doxepin 25 mg HS PRN sleep  Medical Decision Making Problem Points:  Review of psycho-social stressors (1) Data Points:  Review of medication regiment & side effects (2) Review of new medications or change in dosage (2)  I certify that inpatient services furnished can reasonably be expected to improve the patient's condition.   Rachael Feerving A Neila Teem 03/22/2014, 5:48 PM

## 2014-03-22 NOTE — [facility] Group Notes (Signed)
[facility] L[facility] Note   03/22/2014 10:11 AM  Participation Quality:  Appropriate   Mood/Affect:  Appropriate  Depression Rating:  0  Anxiety Rating:  6-nervous about d/c.   Thoughts of Suicide:  No Will you contract for safety?   NA  Current AVH:  No  Plan for Discharge/Comments:  Pt hoping for [facility] admission today. CSW waiting to find out if there is bed available.  Transportation Means: [facility]  Supports: mother  Teaching laboratory technicianHeather Smart LCSWA

## 2014-03-22 NOTE — [facility] Group Notes (Signed)
[facility] LCSW Group Therapy  03/22/2014 2:58 PM  Type of Therapy:  Group Therapy  Participation Level:  Minimal  Participation Quality:  Attentive  Affect:  Flat  Cognitive:  Alert  Insight:  Improving  Engagement in Therapy:  Limited  Modes of Intervention:  Confrontation, Discussion, Education, Exploration, Problem-solving, Rapport Building, Socialization and Support  Summary of Progress/Problems: Emotion Regulation: This group focused on both positive and negative emotion identification and allowed group members to process ways to identify feelings, regulate negative emotions, and find healthy ways to manage internal/external emotions. Group members were asked to reflect on a time when their reaction to an emotion led to a negative outcome and explored how alternative responses using emotion regulation would have benefited them. Group members were also asked to discuss a time when emotion regulation was utilized when a negative emotion was experienced. Nicholas [name] was attentive and engaged throughout today's therapy group. He shared that he struggles with anxiety and anger. Nicholas [name] minimally participated in group discussion but actively listened as other shared, showing some progress in the group setting.    Nicholas [name] LCSWA  03/22/2014, 2:58 PM

## 2014-03-22 NOTE — B[facility] Suicide Risk Assessment (Signed)
[facility] INPATIENT:  Family/Significant Other Suicide Prevention Education  Suicide Prevention Education:  Education Completed; Ms. Corine ShelterWatkins (pt's mother) [phone] has been identified by the patient as the family member/significant other with whom the patient will be residing, and identified as the person(s) who will aid the patient in the event of a mental health crisis (suicidal ideations/suicide attempt).  With written consent from the patient, the family member/significant other has been provided the following suicide prevention education, prior to the and/or following the discharge of the patient.  The suicide prevention education provided includes the following:  Suicide risk factors  Suicide prevention and interventions  National Suicide Hotline telephone number  [facility] assessment telephone number  [facility] Emergency Assistance 911  [facility] and/or Residential Mobile Crisis Unit telephone number  Request made of family/significant other to:  Remove weapons (e.g., guns, rifles, knives), all items previously/currently identified as safety concern.    Remove drugs/medications (over-the-counter, prescriptions, illicit drugs), all items previously/currently identified as a safety concern.  The family member/significant other verbalizes understanding of the suicide prevention education information provided.  The family member/significant other agrees to remove the items of safety concern listed above.  Deonta Bomberger Smart LCSWA  03/22/2014, 1:09 PM

## 2014-03-22 NOTE — Progress Notes (Signed)
Pt reports he is doing well this evening.  He says he had the MD change his sleep aid, because the trazodone was giving him nightmares.  Pt denies SI/HI/AV.  He denies any withdrawal symptoms at this time.  Pt says he has been going to groups and participating.  [facility]  Pt makes his needs known to staff.  He is pleasant and appropriate on the unit.  This afternoon, when he shaved, he cut his R thumb when he tried to get the plastic cover off the blade.  It is wrapped with gauze and tape.  He received Tylenol for the soreness.  Support and encouragement offered.  Safety maintained with q15 minute checks.

## 2014-03-22 NOTE — Progress Notes (Signed)
D: Pt denies SI, Pt denies HI, Pt denies AH and denies VH.  Pt states he is anxious about yawning continuously.  Pt mood and affect observed to be appropriate.    A: RN educated pt about symptoms of opiate withdrawal.  Patient given emotional support from RN. Patient encouraged to come to staff with concerns and/or questions. Patient's medication routine continued. Patient's orders and plan of care reviewed.   R: Pt expressed understanding of opiate withdrawal symptoms.  Patient remains appropriate and cooperative. Will continue to monitor patient q15 minutes for safety.

## 2014-03-22 NOTE — Progress Notes (Signed)
Attended group 

## 2014-03-23 DIAGNOSIS — F1920 Other psychoactive substance dependence, uncomplicated: Secondary | ICD-10-CM

## 2014-03-23 DIAGNOSIS — F1120 Opioid dependence, uncomplicated: Secondary | ICD-10-CM

## 2014-03-23 MED ORDER — CARBAMAZEPINE ER 200 MG PO TB12: 200.0000 mg | ORAL_TABLET | Freq: Two times a day (BID) | ORAL | Status: DC

## 2014-03-23 MED ORDER — DOXEPIN HCL 25 MG PO CAPS: 25.0000 mg | ORAL_CAPSULE | Freq: Every evening | ORAL | Status: DC | PRN

## 2014-03-23 NOTE — ED Provider Notes (Signed)
Medical screening examination/treatment/procedure(s) were performed by non-physician practitioner and as supervising physician I was immediately available for consultation/collaboration.   Toy BakerAnthony T Valene Villa, MD 03/23/14 [phone]

## 2014-03-23 NOTE — Progress Notes (Signed)
[facility] :  Will you be returning to the same living situation after discharge: Yes,  home with mom [facility]. At discharge, do you have transportation home?:Yes,  mother Do you have the ability to pay for your medications:Yes,  none-BCBS expired.  Release of information consent forms completed and submitted to medical records by CSW. Patient to Follow up at: Follow-up Information   Follow up with [facility]. (You are currently on waiting list. Call each morning at 9:15AM to check bed availability. Melissa (intake coordinator) direct line: [phone]. )    Contact information:   [address] [place], [place] [address] Phone: [phone] Fax: [phone]      [facility] [place] On 03/[facility]/2014. (Arrive between 7:45am-11am for hospital follow-up/medication management. Referral number: [id number])    Contact information:   [address] [place], [place] [address] Phone: [phone] Fax: [phone]      Patient denies SI/HI:   Yes,  during group/self report.     Safety Planning and Suicide Prevention discussed:  Yes,  SPE completed with pt's mother. SPI pamphlet provided to pt and he was encouraged to share information with support network, ask questions, and talk about any concerns relating to SPE.  Princes Finger Smart LCSWA  03/23/2014, 12:00 PM

## 2014-03-23 NOTE — Progress Notes (Signed)
Patient ID: Nicholas [name], male   DOB: 05/25/1991, 23 y.o.   MRN: [id number] He has been discharged home and was picked up by his brothers girlfriend. He voiced understanding of discharge instruction and of the follow up plan. He denies thoughts of SI and all his belongings were taken home with him. Stated he planned to return to school.

## 2014-03-23 NOTE — Progress Notes (Signed)
0900 nursing orientation group.   The focus of this group is to educate the patient on the purpose and policies of crisis stabilization and provide a format to answer questions about their admission.  The group details unit policies and expectations of patients while admitted.   Pt was an active participant and was appropriate.  His goal is "[facility] and to stay clean and sober"

## 2014-03-23 NOTE — B[facility] Group Notes (Signed)
[facility] LCSW Group Therapy  03/23/2014 2:39 PM  Type of Therapy:  Group Therapy  Participation Level:  Active  Participation Quality:  Attentive  Affect:  Appropriate  Cognitive:  Alert and Oriented  Insight:  Engaged  Engagement in Therapy:  Engaged  Modes of Intervention:  Confrontation, Discussion, Education, Exploration, Problem-solving, Rapport Building, Socialization and Support  Summary of Progress/Problems:  Finding Balance in Life. Today's group focused on defining balance in one's own words, identifying things that can knock one off balance, and exploring healthy ways to maintain balance in life. Group members were asked to provide an example of a time when they felt off balance, describe how they handled that situation,and process healthier ways to regain balance in the future. Group members were asked to share the most important tool for maintaining balance that [facility] and how they plan to apply this method after discharge. Alycia RossettiRyan was attentive and engaged throughout today's therapy group. He shared that he felt more at balance after making the decision to get sober and focus on recovery. Alycia RossettiRyan shows progress in the group setting and improving insight AEB his ability to process how [facility] and opening up to his mother as a support will help him avoid relapse and be successful in recovery.    Erabella Kuipers Smart LCSWA  03/23/2014, 2:39 PM

## 2014-03-23 NOTE — B[facility] Suicide Risk Assessment (Signed)
Suicide Risk Assessment  Discharge Assessment     Demographic Factors:  Male, Adolescent or young adult and Caucasian  Total Time spent with patient: 45 minutes  Psychiatric Specialty Exam:     Blood pressure 149/86, pulse 96, temperature 97.4 F (36.3 C), temperature source Oral, resp. rate 20, height 6\' 2"  (1.88 m), weight 94.348 kg (208 lb), SpO2 100.00%.Body mass index is 26.69 kg/(m^2).  General Appearance: Fairly Groomed  Patent attorneyye Contact::  Fair  Speech:  Clear and Coherent  Volume:  Normal  Mood:  Euthymic  Affect:  Appropriate  Thought Process:  Coherent and Goal Directed  Orientation:  Full (Time, Place, and Person)  Thought Content:  relapse prevention plan  Suicidal Thoughts:  No  Homicidal Thoughts:  No  Memory:  Immediate;   Fair Recent;   Fair Remote;   Fair  Judgement:  Fair  Insight:  Present  Psychomotor Activity:  Normal  Concentration:  Fair  Recall:  FiservFair  Fund of Knowledge:NA  Language: Fair  Akathisia:  No  Handed:    AIMS (if indicated):     Assets:  Desire for Improvement Talents/Skills  Sleep:  Number of Hours: 5.5    Musculoskeletal: Strength & Muscle Tone: within normal limits Gait & Station: normal Patient leans: N/A   Mental Status Per Nursing Assessment::   On Admission:  NA  Current Mental Status by Physician: In full contact with reality. There are no S/S of withdarwal. Insightful.  Wants to purse a residential treatment program [facility] as soon as the bed is available. He will stay with his mother. Has a relapse prevention plan in place   Loss Factors: NA  Historical Factors: NA  Risk Reduction Factors:   Positive social support  Continued Clinical Symptoms:  Alcohol/Substance Abuse/Dependencies  Cognitive Features That Contribute To Risk:  Closed-mindedness Polarized thinking Thought constriction (tunnel vision)    Suicide Risk:  Minimal: No identifiable suicidal ideation.  Patients presenting with no risk factors  but with morbid ruminations; may be classified as minimal risk based on the severity of the depressive symptoms  Discharge Diagnoses:   AXIS I:  Polysubstance Dependence including opioids AXIS II:  No diagnosis AXIS III:   Past Medical History  Diagnosis Date  . Migraine    AXIS IV:  other psychosocial or environmental problems AXIS V:  61-70 mild symptoms  Plan Of Care/Follow-up recommendations:  Activity:  as tolerated Diet:  regular Follow up [facility] outpatient Is patient on multiple antipsychotic therapies at discharge:  No   Has Patient had three or more failed trials of antipsychotic monotherapy by history:  No  Recommended Plan for Multiple Antipsychotic Therapies: NA    Rachael FeeIrving A Chalet Kerwin 03/23/2014, 12:04 PM

## 2014-03-23 NOTE — Discharge Summary (Signed)
Physician Discharge Summary Note  Patient:  Nicholas [name] is an 23 y.o., male MRN:  [id number] DOB:  09-18-1991 Patient phone:  [phone] (home)  Patient address:   [address] [place] [address],  Total Time spent with patient: Greater than 30 minutes  Date of Admission:  03/17/2014  Date of Discharge: 03/23/14  Reason for Admission:  Opioid detox  Discharge Diagnoses: Principal Problem:   Polysubstance dependence including opioid type drug, continuous use Active Problems:   Benzodiazepine dependence   Substance induced mood disorder  Psychiatric Specialty Exam: Physical Exam  Psychiatric: His speech is normal and behavior is normal. Judgment and thought content normal. His mood appears not anxious. His affect is not angry, not blunt, not labile and not inappropriate. Cognition and memory are normal. He does not exhibit a depressed mood.    Review of Systems  Constitutional: Negative.   HENT: Negative.   Eyes: Negative.   Respiratory: Negative.   Cardiovascular: Negative.   Gastrointestinal: Negative.   Genitourinary: Negative.   Musculoskeletal: Negative.   Skin: Negative.   Neurological: Negative.   Endo/Heme/Allergies: Negative.   Psychiatric/Behavioral: Positive for depression (Stable) and substance abuse (Polysubstance dependence). Negative for suicidal ideas and hallucinations. The patient has insomnia (Stable). The patient is not nervous/anxious.     Blood pressure 149/86, pulse 96, temperature 97.4 F (36.3 C), temperature source Oral, resp. rate 20, height 6\' 2"  (1.88 m), weight 94.348 kg (208 lb), SpO2 100.00%.Body mass index is 26.69 kg/(m^2).   General Appearance: Fairly Groomed   Patent attorney:: Fair   Speech: Clear and Coherent   Volume: Normal   Mood: Euthymic   Affect: Appropriate   Thought Process: Coherent and Goal Directed   Orientation: Full (Time, Place, and Person)   Thought Content: relapse prevention plan   Suicidal Thoughts: No    Homicidal Thoughts: No   Memory: Immediate; Fair  Recent; Fair  Remote; Fair   Judgement: Fair   Insight: Present   Psychomotor Activity: Normal   Concentration: Fair   Recall: [employer] of Knowledge:NA   Language: Fair   Akathisia: No   Handed:   AIMS (if indicated):   Assets: Desire for Improvement  Talents/Skills   Sleep: Number of Hours: 5.5    Past Psychiatric History: Diagnosis: Polysubstance dependence including opioid type drug, continuous use, Substance induced mood disorder  Hospitalizations: [facility] adult unit  Outpatient Care:   Substance Abuse Care: [facility] referral  Self-Mutilation: NA  Suicidal Attempts: NA  Violent Behaviors: NA   Musculoskeletal: Strength & Muscle Tone: within normal limits Gait & Station: normal Patient leans: N/A  DSM5: Schizophrenia Disorders:  NA Obsessive-Compulsive Disorders:  NA Trauma-Stressor Disorders:  NA Substance/Addictive Disorders:  Benzodiazepine dependence, Polysubstance dependece Depressive Disorders:  Substance induced mood disorder  Axis Diagnosis:   AXIS I:  Polysubstance dependence including opioid type drug, continuous use, Substance induced mood disorder AXIS II:  Deferred AXIS III:   Past Medical History  Diagnosis Date  . Migraine    AXIS IV:  other psychosocial or environmental problems and Polysubstance dependence AXIS V:  63  Level of Care:  OP  Hospital Course:  23 year old white male requesting detox from opiates. Patient reports that he has been addicted to opiates for approximately 18 months. Patient reports his last usage of oxycodone was 30 mg and it was this morning. Patient reports that he uses 150-180mg  daily. Patient denies HI; however, when he does not use he "gets violent, tearing up  stuff." Additionally, he also reports he wants to hurt himself or hurt others when he does not use opiates.  Alycia RossettiRyan was admitted to the hospital with his UDS reports positive for Benzodiazepine, opiates and THC.  He was in need of drug detoxification treatment. He was ordered and received both clonidine and Librium detox treatment/regimen/protocols. He was also enrolled in the group counseling sessions and AA/NA meetings being offered and held on this unit. He learned coping skills.Alycia RossettiRyan also was medicated with Doxepin 25 mg Q bedtime for sleep. Alycia RossettiRyan presented no other medical issues that requires treatment and or monitoring. He tolerated his treatment regimen without any significant adverse effects and or reactions.  Alycia RossettiRyan has completed detox treatment. His mood is stable. He is currently being discharged to follow-up psychiatric care and medication managment at [facility] [place], [place]. And for continuation of substance abuse treatment, an [facility] referral has been made. [facility] when [facility] becomes available. Upon discharge, Alycia RossettiRyan adamantly denies any SIHI, AVH, delusional thoughts, paranoia and or withdrawal symptoms. He was provided with a 14 days worth supply samples of his [facility] discharge medications. He [facility] with all personal belongings in no distress. Transportation per mother.  Consults:  psychiatry  Significant Diagnostic Studies:  labs: CBC with diff, CMP, UDS, toxicology tests, U/A  Discharge Vitals:   Blood pressure 149/86, pulse 96, temperature 97.4 F (36.3 C), temperature source Oral, resp. rate 20, height 6\' 2"  (1.88 m), weight 94.348 kg (208 lb), SpO2 100.00%. Body mass index is 26.69 kg/(m^2). Lab Results:   No results found for this or any previous visit (from the past 72 hour(s)).  Physical Findings: AIMS: Facial and Oral Movements Muscles of Facial Expression: None, normal Lips and Perioral Area: None, normal Jaw: None, normal Tongue: None, normal,Extremity Movements Upper (arms, wrists, hands, fingers): None, normal Lower (legs, knees, ankles, toes): None, normal, Trunk Movements Neck, shoulders, hips: None, normal, Overall Severity Severity  of abnormal movements (highest score from questions above): None, normal Incapacitation due to abnormal movements: None, normal Patient's awareness of abnormal movements (rate only patient's report): No Awareness, Dental Status Current problems with teeth and/or dentures?: No Does patient usually wear dentures?: No  CIWA:  CIWA-Ar Total: 5 COWS:  COWS Total Score: 0  Psychiatric Specialty Exam: See Psychiatric Specialty Exam and Suicide Risk Assessment completed by Attending Physician prior to discharge.  Discharge destination:  Home  Is patient on multiple antipsychotic therapies at discharge:  No   Has Patient had three or more failed trials of antipsychotic monotherapy by history:  No  Recommended Plan for Multiple Antipsychotic Therapies: NA     Medication List    STOP taking these medications       5-HTP PO     ALPRAZOLAM PO     CLONAZEPAM PO     DILAUDID PO     Ginkgo Biloba 40 MG Tabs     HYDROCODONE-ACETAMINOPHEN PO     OXYCODONE HCL PO     OXYMORPHONE HCL PO      TAKE these medications     Indication   carbamazepine 200 MG 12 hr tablet  Commonly known as:  TEGRETOL XR  Take 1 tablet (200 mg total) by mouth 2 (two) times daily. For mood stabilization   Indication:  Mood stabilization     doxepin 25 MG capsule  Commonly known as:  SINEQUAN  Take 1 capsule (25 mg total) by mouth at bedtime as needed and may  repeat dose one time if needed (insomnia).   Indication:  Insomnia       Follow-up Information   Follow up with [facility]. (You are currently on waiting list. Call each morning at 9:15AM to check bed availability. Melissa (intake coordinator) direct line: [phone]. )    Contact information:   [address] [place], [place] [address] Phone: [phone] Fax: [phone]      [facility] up with [facility] [place] On 03/27/2014. (Arrive between 7:45am-11am for hospital follow-up/medication management. Referral number: [id number])    Contact  information:   [address] [place], [place] [address] Phone: [phone] Fax: [phone]     Follow-up recommendations: Activity:  As tolerated Diet: As recommended by your primary care doctor. Keep all scheduled follow-up appointments as recommended.    Comments: Take all your medications as prescribed by your mental healthcare provider. Report any adverse effects and or reactions from your medicines to your outpatient provider promptly. Patient is instructed and cautioned to not engage in alcohol and or illegal drug use while on prescription medicines. In the event of worsening symptoms, patient is instructed to call the crisis hotline, 911 and or go to the nearest ED for appropriate evaluation and treatment of symptoms. Follow-up with your primary care provider for your other medical issues, concerns and or health care needs.    Total Discharge Time:  Greater than 30 minutes.  Signed: Sanjuana KavaAgnes I Nwoko, PMHNP, FNP-BC 03/23/2014, 1:59 PM Personally evaluated the patient and agree with assessment and plan Madie RenoIrving A. Dub MikesLugo, M.D.

## 2014-03-28 NOTE — Progress Notes (Signed)
Patient Discharge Instructions:  After Visit Summary (AVS):   Faxed to:  03/28/14 Discharge Summary Note:   Faxed to:  03/28/14 Psychiatric Admission Assessment Note:   Faxed to:  03/28/14 Suicide Risk Assessment - Discharge Assessment:   Faxed to:  03/28/14 Faxed/Sent to the Next Level Care provider:  03/28/14 [facility]axed to [facility] @ [phone]  Jerelene ReddenSheena E [place], 03/28/2014, 3:02 PM

## 2015-01-08 ENCOUNTER — Encounter (HOSPITAL_COMMUNITY): Payer: Self-pay

## 2015-01-08 ENCOUNTER — Inpatient Hospital Stay (HOSPITAL_COMMUNITY)
Admission: EM | Admit: 2015-01-08 | Discharge: 2015-01-14 | DRG: 897 | Disposition: A | Discharge status: Home / Self Care | Payer: 59 | Source: Intra-hospital | Attending: Psychiatry | Admitting: Psychiatry

## 2015-01-08 ENCOUNTER — Encounter (HOSPITAL_COMMUNITY): Payer: Self-pay | Admitting: Emergency Medicine

## 2015-01-08 ENCOUNTER — Emergency Department (HOSPITAL_COMMUNITY)
Admission: EM | Admit: 2015-01-08 | Discharge: 2015-01-08 | Disposition: A | Discharge status: Other Acute Inpatient Hospital | Payer: Self-pay | Attending: Emergency Medicine | Admitting: Emergency Medicine

## 2015-01-08 DIAGNOSIS — F11220 Opioid dependence with intoxication, uncomplicated: Secondary | ICD-10-CM | POA: Diagnosis present

## 2015-01-08 DIAGNOSIS — R45851 Suicidal ideations: Secondary | ICD-10-CM | POA: Diagnosis present

## 2015-01-08 DIAGNOSIS — G43909 Migraine, unspecified, not intractable, without status migrainosus: Secondary | ICD-10-CM | POA: Insufficient documentation

## 2015-01-08 DIAGNOSIS — F329 Major depressive disorder, single episode, unspecified: Secondary | ICD-10-CM | POA: Diagnosis present

## 2015-01-08 DIAGNOSIS — F1290 Cannabis use, unspecified, uncomplicated: Secondary | ICD-10-CM | POA: Diagnosis present

## 2015-01-08 DIAGNOSIS — T50902A Poisoning by unspecified drugs, medicaments and biological substances, intentional self-harm, initial encounter: Secondary | ICD-10-CM | POA: Insufficient documentation

## 2015-01-08 DIAGNOSIS — F1920 Other psychoactive substance dependence, uncomplicated: Secondary | ICD-10-CM | POA: Diagnosis present

## 2015-01-08 DIAGNOSIS — Z79899 Other long term (current) drug therapy: Secondary | ICD-10-CM | POA: Insufficient documentation

## 2015-01-08 DIAGNOSIS — F1124 Opioid dependence with opioid-induced mood disorder: Principal | ICD-10-CM | POA: Diagnosis present

## 2015-01-08 LAB — COMPREHENSIVE METABOLIC PANEL
ALBUMIN: 4.7 g/dL (ref 3.5–5.2)
ALK PHOS: 158 U/L — AB (ref 39–117)
ALT: 56 U/L — AB (ref 0–53)
AST: 35 U/L (ref 0–37)
Anion gap: 7 (ref 5–15)
BUN: 12 mg/dL (ref 6–23)
CALCIUM: 9.8 mg/dL (ref 8.4–10.5)
CO2: 26 mmol/L (ref 19–32)
CREATININE: 0.73 mg/dL (ref 0.50–1.35)
Chloride: 104 mmol/L (ref 96–112)
GFR calc Af Amer: >90 mL/min (ref >90)
GFR calc non Af Amer: >90 mL/min (ref >90)
GLUCOSE: 109 mg/dL — AB (ref 70–99)
POTASSIUM: 3.9 mmol/L (ref 3.5–5.1)
SODIUM: 137 mmol/L (ref 135–145)
Total Bilirubin: 0.7 mg/dL (ref 0.3–1.2)
Total Protein: 8.7 g/dL — ABNORMAL HIGH (ref 6.0–8.3)

## 2015-01-08 LAB — CBC
HCT: 38.3 % — ABNORMAL LOW (ref 39.0–52.0)
Hemoglobin: 13 g/dL (ref 13.0–17.0)
MCH: 32.3 pg (ref 26.0–34.0)
MCHC: 33.9 g/dL (ref 30.0–36.0)
MCV: 95.3 fL (ref 78.0–100.0)
Platelets: 277 10*3/uL (ref 150–400)
RBC: 4.02 MIL/uL — ABNORMAL LOW (ref 4.22–5.81)
RDW: 12.8 % (ref 11.5–15.5)
WBC: 11.3 10*3/uL — ABNORMAL HIGH (ref 4.0–10.5)

## 2015-01-08 LAB — RAPID URINE DRUG SCREEN, HOSP PERFORMED
Amphetamines: NOT DETECTED
BENZODIAZEPINES: POSITIVE — AB
Barbiturates: NOT DETECTED
COCAINE: NOT DETECTED
Opiates: POSITIVE — AB
TETRAHYDROCANNABINOL: POSITIVE — AB

## 2015-01-08 LAB — SALICYLATE LEVEL

## 2015-01-08 LAB — CARBAMAZEPINE LEVEL, TOTAL: Carbamazepine Lvl: <2 ug/mL — ABNORMAL LOW (ref 4.0–12.0)

## 2015-01-08 LAB — ACETAMINOPHEN LEVEL: Acetaminophen (Tylenol), Serum: <10 ug/mL — ABNORMAL LOW (ref 10–30)

## 2015-01-08 LAB — ETHANOL: Alcohol, Ethyl (B): <5 mg/dL (ref 0–9)

## 2015-01-08 MED ORDER — NAPROXEN 500 MG PO TABS
500.0000 mg | ORAL_TABLET | Freq: Two times a day (BID) | ORAL | Status: AC | PRN
  Administered 2015-01-09 – 2015-01-10 (×2): 500 mg via ORAL
  Filled 2015-01-08 (×2): qty 1

## 2015-01-08 MED ORDER — CLONIDINE HCL 0.1 MG PO TABS
0.1000 mg | ORAL_TABLET | ORAL | Status: AC
  Administered 2015-01-11 – 2015-01-12 (×3): 0.1 mg via ORAL
  Filled 2015-01-08 (×5): qty 1

## 2015-01-08 MED ORDER — LOPERAMIDE HCL 2 MG PO CAPS: 2.0000 mg | ORAL_CAPSULE | ORAL | Status: DC | PRN

## 2015-01-08 MED ORDER — ACETAMINOPHEN 325 MG PO TABS
650.0000 mg | ORAL_TABLET | Freq: Four times a day (QID) | ORAL | Status: DC | PRN
  Administered 2015-01-09: 650 mg via ORAL
  Filled 2015-01-08: qty 2

## 2015-01-08 MED ORDER — DIPHENHYDRAMINE HCL 50 MG PO CAPS
50.0000 mg | ORAL_CAPSULE | Freq: Every evening | ORAL | Status: DC | PRN
  Administered 2015-01-08 – 2015-01-09 (×3): 50 mg via ORAL
  Filled 2015-01-08 (×3): qty 1
  Filled 2015-01-08: qty 2
  Filled 2015-01-08 (×2): qty 1

## 2015-01-08 MED ORDER — DICYCLOMINE HCL 20 MG PO TABS: 20.0000 mg | ORAL_TABLET | Freq: Four times a day (QID) | ORAL | Status: AC | PRN

## 2015-01-08 MED ORDER — MAGNESIUM HYDROXIDE 400 MG/5ML PO SUSP: 30.0000 mL | Freq: Every day | ORAL | Status: DC | PRN

## 2015-01-08 MED ORDER — CLONIDINE HCL 0.1 MG PO TABS
0.1000 mg | ORAL_TABLET | Freq: Four times a day (QID) | ORAL | Status: AC
  Administered 2015-01-08 – 2015-01-11 (×9): 0.1 mg via ORAL
  Filled 2015-01-08 (×13): qty 1

## 2015-01-08 MED ORDER — METHOCARBAMOL 500 MG PO TABS
500.0000 mg | ORAL_TABLET | Freq: Three times a day (TID) | ORAL | Status: AC | PRN
  Administered 2015-01-10 – 2015-01-11 (×3): 500 mg via ORAL
  Filled 2015-01-08 (×3): qty 1

## 2015-01-08 MED ORDER — ALUM & MAG HYDROXIDE-SIMETH 200-200-20 MG/5ML PO SUSP
30.0000 mL | ORAL | Status: DC | PRN
Start: 2015-01-08 — End: 2015-01-15

## 2015-01-08 MED ORDER — ONDANSETRON 4 MG PO TBDP: 4.0000 mg | ORAL_TABLET | Freq: Four times a day (QID) | ORAL | Status: DC | PRN

## 2015-01-08 MED ORDER — CLONIDINE HCL 0.1 MG PO TABS
0.1000 mg | ORAL_TABLET | Freq: Every day | ORAL | Status: DC
Start: 2015-01-14 — End: 2015-01-15
  Administered 2015-01-14: 0.1 mg via ORAL
  Filled 2015-01-08 (×2): qty 1

## 2015-01-08 MED ORDER — HYDROXYZINE HCL 25 MG PO TABS: 25.0000 mg | ORAL_TABLET | Freq: Four times a day (QID) | ORAL | Status: DC | PRN

## 2015-01-08 NOTE — ED Notes (Signed)
[facility] but requests that report be held until all of pt lab work is resulted.

## 2015-01-08 NOTE — ED Provider Notes (Signed)
CSN: [id number]     Arrival date & time 01/08/15  1645 History  This chart was scribed for Glynn OctaveStephen Effa Yarrow, MD by Gwenyth Oberatherine Macek, ED Scribe. This patient was seen in room [facility] and the patient's care was started at 5:09 PM.    Chief Complaint  Patient presents with  . Psychiatric Evaluation   The history is provided by the patient. No language interpreter was used.    HPI Comments: Nicholas [name] is a 24 y.o. male with a history of migraines who presents to the Emergency Department with thoughts of hurting himself. He reports thoughts started 3 hours ago [place]. Pt states that things did not go well today and that he has had a lot of plans shot down recently.  He has no specific plan for self-injury. Pt called the police for help from his home where he lives with his mother. There are guns present in the home, but they are locked up. Pt notes decreased fluid intake. He denies a history of similar symptoms, depression and EtOH/drug use. Pt also denies HI, CP, SOB and abdominal pain as associated symptoms.  Past Medical History  Diagnosis Date  . Migraine    Past Surgical History  Procedure Laterality Date  . Appendectomy     History reviewed. No pertinent family history. History  Substance Use Topics  . Smoking status: Never Smoker   . Smokeless tobacco: Not on file  . Alcohol Use: No    Review of Systems  A complete 10 system review of systems was obtained and all systems are negative except as noted in the HPI and PMH.    Allergies  Review of patient's allergies indicates no known allergies.  Home Medications   Prior to Admission medications   Medication Sig Start Date End Date Taking? Authorizing Provider  carbamazepine (TEGRETOL XR) 200 MG 12 hr tablet Take 1 tablet (200 mg total) by mouth 2 (two) times daily. For mood stabilization Patient not taking: Reported on 01/08/2015 03/23/14   Sanjuana KavaAgnes I Nwoko, NP  doxepin (SINEQUAN) 25 MG capsule Take 1  capsule (25 mg total) by mouth at bedtime as needed and may repeat dose one time if needed (insomnia). Patient not taking: Reported on 01/08/2015 03/23/14   Sanjuana KavaAgnes I Nwoko, NP   BP 153/87 mmHg  Pulse 84  Temp(Src) 98.7 F (37.1 C) (Oral)  Resp 20  Ht 6\' 2"  (1.88 m)  Wt 220 lb (99.791 kg)  BMI 28.23 kg/m2  SpO2 99% Physical Exam  Constitutional: He is oriented to person, place, and time. He appears well-developed and well-nourished. No distress.  HENT:  Head: Normocephalic and atraumatic.  Mouth/Throat: Oropharynx is clear and moist. No oropharyngeal exudate.  Eyes: Conjunctivae and EOM are normal. Pupils are equal, round, and reactive to light.  Neck: Normal range of motion. Neck supple.  No meningismus.  Cardiovascular: Normal rate, regular rhythm, normal heart sounds and intact distal pulses.   No murmur heard. Pulmonary/Chest: Effort normal and breath sounds normal. No respiratory distress.  Abdominal: Soft. There is no tenderness. There is no rebound and no guarding.  Musculoskeletal: Normal range of motion. He exhibits no edema or tenderness.  Neurological: He is alert and oriented to person, place, and time. No cranial nerve deficit. He exhibits normal muscle tone. Coordination normal.  No ataxia on finger to nose bilaterally. No pronator drift. 5/5 strength throughout. CN 2-12 intact. Negative Romberg. Equal grip strength. Sensation intact. Gait is normal.   Skin:  Skin is warm.  Psychiatric: His behavior is normal.  Flat affect  Nursing note and vitals reviewed.   ED Course  Procedures (including critical care time) DIAGNOSTIC STUDIES: Oxygen Saturation is 99% on RA, normal by my interpretation.    COORDINATION OF CARE: 5:13 PM Discussed treatment plan with pt at bedside and pt agreed to plan.  Labs Review Labs Reviewed  ACETAMINOPHEN LEVEL - Abnormal; Notable for the following:    Acetaminophen (Tylenol), Serum <10.0 (*)    All other components within normal limits   CBC - Abnormal; Notable for the following:    WBC 11.3 (*)    RBC 4.02 (*)    HCT 38.3 (*)    All other components within normal limits  COMPREHENSIVE METABOLIC PANEL - Abnormal; Notable for the following:    Glucose, Bld 109 (*)    Total Protein 8.7 (*)    ALT 56 (*)    Alkaline Phosphatase 158 (*)    All other components within normal limits  URINE RAPID DRUG SCREEN (HOSP PERFORMED) - Abnormal; Notable for the following:    Opiates POSITIVE (*)    Benzodiazepines POSITIVE (*)    Tetrahydrocannabinol POSITIVE (*)    All other components within normal limits  CARBAMAZEPINE LEVEL, TOTAL - Abnormal; Notable for the following:    Carbamazepine Lvl <2.0 (*)    All other components within normal limits  ETHANOL  SALICYLATE LEVEL    Imaging Review No results found.   EKG Interpretation None      MDM   Final diagnoses:  Suicidal ideation   Suicidal thoughts without plan. No homicidal thoughts or hallucinations. Denies illicit drug use.  Tegretol level undetectable. Drug screen positive for opiates, benzos, THC  Screening labs unremarkable.  Patient accepted to [facility] by Dr. Jama Flavors.  I personally performed the services described in this documentation, which was scribed in my presence. The recorded information has been reviewed and is accurate.   Glynn Octave, MD 01/08/15 2325

## 2015-01-08 NOTE — BH Assessment (Addendum)
Tele Assessment Note   Nicholas [name] is an 24 y.o. male. Pt arrived voluntarily to [facility] reporting SI. Pt denies HI. Pt denies delusions and hallucinations. Pt states that his SI began today after his court case. Pt denies SI plan. Pt states that if he goes home today he will harm himself. Pt states "My life is over" "I have nothing to live for." According to the Pt, the court case did not go in favor. Pt has been charged with [employer]. Pt states that he has been charged with stealing food from [employer]. Pt was hospitalized at [facility] for polysubstance abuse and mood disorder. Pt denied substance abuse. Pt denied prior inpatient treatment. Pt denies current mental health treatment. Pt denies medication. Pt states he is currently depressed. Pt reports the following depressive symptoms: tearfulness, anger/irritablility, guilt, and decreased sleep.  Writer consulted with Dr. Lolly Mustache. Per Dr. Lolly Mustache the Pt meets inpatient criteria. Pt accepted to [facility]. Pt accepted to 302-1.  Axis I: Depressive Disorder NOS Axis II: Deferred Axis III:  Past Medical History  Diagnosis Date  . Migraine    Axis IV: other psychosocial or environmental problems, problems related to legal system/crime and problems related to social environment Axis V: 31-40 impairment in reality testing  Past Medical History:  Past Medical History  Diagnosis Date  . Migraine     Past Surgical History  Procedure Laterality Date  . Appendectomy      Family History: History reviewed. No pertinent family history.  Social History:  reports that he has never smoked. He does not have any smokeless tobacco history on file. He reports that he uses illicit drugs (Marijuana, Cocaine, Benzodiazepines, Oxycodone, and Hydrocodone). He reports that he does not drink alcohol.  Additional Social History:  Alcohol / Drug Use Pain Medications: Pt denies  Prescriptions: Pt denies Over the Counter: Pt denies History of alcohol /  drug use?: No history of alcohol / drug abuse Longest period of sobriety (when/how long): NA  CIWA: CIWA-Ar BP: 115/81 mmHg Pulse Rate: 87 COWS:    PATIENT STRENGTHS: (choose at least two) Communication skills Supportive family/friends  Allergies: No Known Allergies  Home Medications:  (Not in a hospital admission)  OB/GYN Status:  No LMP for male patient.  General Assessment Data Location of Assessment: [facility] ACT Assessment: Yes Is this a Tele or Face-to-Face Assessment?: Tele Assessment Is this an Initial Assessment or a Re-assessment for this encounter?: Initial Assessment Living Arrangements: Parent Can pt return to current living arrangement?: Yes Admission Status: Voluntary Is patient capable of signing voluntary admission?: Yes Transfer from: Home Referral Source: Self/Family/Friend     [facility] Crisis Care Plan Living Arrangements: Parent Name of Psychiatrist: None Name of Therapist: None  Education Status Is patient currently in school?: No Current Grade: 12 Highest grade of school patient has completed: Some college Name of school: NA Contact person: NA  Risk to self with the past 6 months Suicidal Ideation: Yes-Currently Present Suicidal Intent: Yes-Currently Present Is patient at risk for suicide?: Yes Suicidal Plan?: No Access to Means: No What has been your use of drugs/alcohol within the last 12 months?: Pt denies but has history of drug use Previous Attempts/Gestures: No How many times?: 0 Other Self Harm Risks: None Triggers for Past Attempts: None known Intentional Self Injurious Behavior: None Family Suicide History: No Recent stressful life event(s): Legal Issues (Pending court case) Persecutory voices/beliefs?: No Depression: Yes Depression Symptoms: Feeling worthless/self pity, Feeling angry/irritable Substance abuse  history and/or treatment for substance abuse?: Yes (Pt denies but there is documented history) Suicide prevention  information given to non-admitted patients: Not applicable  Risk to Others within the past 6 months Homicidal Ideation: No Thoughts of Harm to Others: No Current Homicidal Intent: No Current Homicidal Plan: No Access to Homicidal Means: No Identified Victim: NA History of harm to others?: No Assessment of Violence: None Noted Violent Behavior Description: NA Does patient have access to weapons?: Yes (Comment) (Reports the guns in his home are locked) Criminal Charges Pending?: Yes Describe Pending Criminal Charges: misdemeanor larceny Does patient have a court date: Yes Court Date: 01/08/15  Psychosis Hallucinations: None noted Delusions: None noted  Mental Status Report Appear/Hygiene: Unremarkable Eye Contact: Good Motor Activity: Freedom of movement Speech: Logical/coherent Level of Consciousness: Alert Mood: Anxious, Depressed Affect: Appropriate to circumstance Anxiety Level: None Thought Processes: Coherent, Relevant Judgement: Impaired Orientation: Person, Place, Time, Situation, Appropriate for developmental age Obsessive Compulsive Thoughts/Behaviors: None  Cognitive Functioning Concentration: Normal Memory: Recent Intact, Remote Intact IQ: Average Insight: Fair Impulse Control: Fair Appetite: Fair Weight Loss: 0 Weight Gain: 0 Sleep: Decreased Total Hours of Sleep: 5 Vegetative Symptoms: None  ADLScreening [facility] Assessment Services) Patient's cognitive ability adequate to safely complete daily activities?: Yes Patient able to express need for assistance with ADLs?: Yes Independently performs ADLs?: Yes (appropriate for developmental age)  Prior Inpatient Therapy Prior Inpatient Therapy: Yes Prior Therapy Dates: 2015 [facility] Reason for Treatment: Substance abuse  Prior Outpatient Therapy Prior Outpatient Therapy: No Prior Therapy Dates: None Prior Therapy Facilty/Provider(s): NA Reason for Treatment: NA  ADL  Screening (condition at time of admission) Patient's cognitive ability adequate to safely complete daily activities?: Yes Is the patient deaf or have difficulty hearing?: No Does the patient have difficulty seeing, even when wearing glasses/contacts?: No Does the patient have difficulty concentrating, remembering, or making decisions?: No Patient able to express need for assistance with ADLs?: Yes Does the patient have difficulty dressing or bathing?: No Independently performs ADLs?: Yes (appropriate for developmental age) Does the patient have difficulty walking or climbing stairs?: No Weakness of Legs: None Weakness of Arms/Hands: None       Abuse/Neglect Assessment (Assessment to be complete while patient is alone) Physical Abuse: Denies Verbal Abuse: Denies Sexual Abuse: Denies Exploitation of patient/patient's resources: Denies Self-Neglect: Denies Values / Beliefs Cultural Requests During Hospitalization: None Spiritual Requests During Hospitalization: None   Advance Directives (For Healthcare) Does patient have an advance directive?: No Would patient like information on creating an advanced directive?: No - patient declined information    Additional Information 1:1 In Past 12 Months?: No CIRT Risk: No Elopement Risk: No Does patient have medical clearance?: Yes     Disposition:  Disposition Initial Assessment Completed for this Encounter: Yes Disposition of Patient: Inpatient treatment program Type of inpatient treatment program: Adult  Emmit PomfretLevette,Aailyah Dunbar D 01/08/2015 6:23 PM

## 2015-01-08 NOTE — ED Notes (Signed)
[facility]

## 2015-01-08 NOTE — ED Notes (Signed)
telepsych completed. Pt back in stretcher.

## 2015-01-08 NOTE — ED Notes (Signed)
Pt states that he has been having thoughts of harming himself with no plan at this time.  States that he has just had a lot of plans that have been shot down recently.

## 2015-01-09 DIAGNOSIS — F11220 Opioid dependence with intoxication, uncomplicated: Secondary | ICD-10-CM

## 2015-01-09 DIAGNOSIS — T50902A Poisoning by unspecified drugs, medicaments and biological substances, intentional self-harm, initial encounter: Secondary | ICD-10-CM

## 2015-01-09 LAB — LIPID PANEL
CHOL/HDL RATIO: 4.3 ratio
Cholesterol: 176 mg/dL (ref 0–200)
HDL: 41 mg/dL (ref >39)
LDL Cholesterol: 110 mg/dL — ABNORMAL HIGH (ref 0–99)
Triglycerides: 123 mg/dL (ref <150)
VLDL: 25 mg/dL (ref 0–40)

## 2015-01-09 LAB — TSH: TSH: 0.563 u[IU]/mL (ref 0.350–4.500)

## 2015-01-09 LAB — CARBAMAZEPINE LEVEL, TOTAL

## 2015-01-09 MED ORDER — HYDROXYZINE HCL 25 MG PO TABS
25.0000 mg | ORAL_TABLET | Freq: Four times a day (QID) | ORAL | Status: DC | PRN
  Administered 2015-01-10: 25 mg via ORAL
  Filled 2015-01-09: qty 1

## 2015-01-09 MED ORDER — ONDANSETRON 4 MG PO TBDP
4.0000 mg | ORAL_TABLET | Freq: Four times a day (QID) | ORAL | Status: AC | PRN
  Administered 2015-01-10 – 2015-01-12 (×4): 4 mg via ORAL
  Filled 2015-01-09 (×4): qty 1

## 2015-01-09 MED ORDER — LORAZEPAM 1 MG PO TABS
1.0000 mg | ORAL_TABLET | Freq: Every day | ORAL | Status: AC
  Administered 2015-01-12: 1 mg via ORAL
  Filled 2015-01-09: qty 1

## 2015-01-09 MED ORDER — LORAZEPAM 1 MG PO TABS
1.0000 mg | ORAL_TABLET | Freq: Two times a day (BID) | ORAL | Status: AC
  Administered 2015-01-11 (×2): 1 mg via ORAL
  Filled 2015-01-09 (×2): qty 1

## 2015-01-09 MED ORDER — LOPERAMIDE HCL 2 MG PO CAPS: 2.0000 mg | ORAL_CAPSULE | ORAL | Status: AC | PRN

## 2015-01-09 MED ORDER — LORAZEPAM 1 MG PO TABS
1.0000 mg | ORAL_TABLET | Freq: Four times a day (QID) | ORAL | Status: AC | PRN
  Administered 2015-01-09: 1 mg via ORAL
  Filled 2015-01-09: qty 1

## 2015-01-09 MED ORDER — LORAZEPAM 1 MG PO TABS
1.0000 mg | ORAL_TABLET | Freq: Four times a day (QID) | ORAL | Status: AC
  Administered 2015-01-09 (×3): 1 mg via ORAL
  Filled 2015-01-09 (×3): qty 1

## 2015-01-09 MED ORDER — THIAMINE HCL 100 MG/ML IJ SOLN: 100.0000 mg | Freq: Once | INTRAMUSCULAR | Status: AC

## 2015-01-09 MED ORDER — LORAZEPAM 1 MG PO TABS
1.0000 mg | ORAL_TABLET | Freq: Three times a day (TID) | ORAL | Status: AC
  Administered 2015-01-10 (×3): 1 mg via ORAL
  Filled 2015-01-09 (×3): qty 1

## 2015-01-09 MED ORDER — ADULT MULTIVITAMIN W/MINERALS CH
1.0000 | ORAL_TABLET | Freq: Every day | ORAL | Status: DC
  Administered 2015-01-09 – 2015-01-12 (×4): 1 via ORAL
  Filled 2015-01-09 (×9): qty 1

## 2015-01-09 MED ORDER — VITAMIN B-1 100 MG PO TABS
100.0000 mg | ORAL_TABLET | Freq: Every day | ORAL | Status: DC
  Administered 2015-01-10 – 2015-01-12 (×3): 100 mg via ORAL
  Filled 2015-01-09 (×9): qty 1

## 2015-01-09 NOTE — Progress Notes (Signed)
Patient ID: Nicholas [name], male   DOB: 07/20/1991, 24 y.o.   MRN: [id number]  Admission note: D:Patient is a voluntary admission in no acute distress for for polysubstance abuse, anxiety SI with no plan. Pt reports he stopped taking his medication because his medication was not working. Pt reports he has not been able to sleep and has been self medicating with different medication brought off the Internet. Pt reports using "acid" and THC two weeks ago. Pt report having pending drug charges. Pt endorses passive SI and contract to come to staff. Pt denies HI/AVH.  A: Pt admitted to unit per protocol, skin assessment and belonging search done. No skin issues noted. Consent signed by pt. Pt educated on therapeutic milieu rules. Pt was introduced to milieu by nursing staff. Fall risk safety plan explained to the patient. 15 minutes checks started for safety.  R: Pt was receptive to education. Writer offered support.

## 2015-01-09 NOTE — Progress Notes (Signed)
Assumed care of patient at 1300. Patient asking for medications as he is aware he's been placed on the ativan protocol. He is flat, sad and anxious both in affect and mood. 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 Given support and encouraged to participate in programming. Patient verbalized understanding. Denies pain and reports only physical complaint is sweats. He verbally contracts for safety and denies HI/AVH. Nicholas [name], Nicholas [name]

## 2015-01-09 NOTE — BHH Suicide Risk Assessment (Signed)
[facility] Admission Suicide Risk Assessment   Nursing information obtained from:  Patient Demographic factors:  Male, Unemployed Current Mental Status:  Suicidal ideation indicated by patient, Self-harm thoughts Loss Factors:  Legal issues, Financial problems / change in socioeconomic status, Loss of significant relationship Historical Factors:  Impulsivity Risk Reduction Factors:  Positive coping skills or problem solving skills Total Time spent with patient: 45 minutes Principal Problem: Suicide attempt by overdosing on opiates, Poly-substance Dependence  Diagnosis:   Patient Active Problem List   Diagnosis Date Noted  . Polysubstance dependence including opioid type drug without complication, episodic abuse [F11.220] 01/08/2015  . Polysubstance dependence including opioid type drug, continuous use [F11.229] 03/18/2014  . Substance induced mood disorder [F19.94] 03/18/2014  . Opiate dependence [F11.20] 03/17/2014  . 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  World Science writer [facility]). Score between 0-7:  no or low risk or alcohol related problems. Score between 8-15:  moderate risk of alcohol related problems. Score between 16-19:  high risk of alcohol related problems. Score 20 or above:  warrants further diagnostic evaluation for alcohol dependence and treatment.   CLINICAL FACTORS:  24 year old man, known to our service from prior psychiatric admission. History of polysubstance dependence. States he was recently doing better, and was enrolled in college . He was charged with larceny and was officially charged yesterday in court. Patient then became acutely depressed, upset, and impulsively overdosed " on like 50 percocets". Suicide attempt was impulsive and not planned out. He has  a history of opiate dependence, but states was not using regularly recently, also has a history of abusing BZDs and states he was taking 2 mgrs daily of " tizolam" ( unspecified BZD he was getting via internet), and was smoking cannabis daily. At this time dysphoric, depressed, but no active SI. No current severe tremors or diaphoresis, slightly restless . Vitals stable .   Musculoskeletal: Strength & Muscle Tone: within normal limits Gait & Station: normal Patient leans: N/A  Psychiatric Specialty Exam: Physical Exam  ROS  Blood pressure 124/67, pulse 83, temperature 97.7 F (36.5 C), temperature source Oral, resp. rate 16, height 6' 1.5" (1.867 m), weight 229 lb (103.874 kg).Body mass index is 29.8 kg/(m^2).  General Appearance: Fairly Groomed  Patent attorney::  Fair  Speech:  Normal Rate  Volume:  Normal  Mood:  Dysphoric and Irritable  Affect:  Congruent  Thought Process:  Goal Directed and Linear  Orientation:  Full (Time, Place, and Person)  Thought Content:  denies hallucinations, no delusions expressed, does not appear internally preoccupied   Suicidal Thoughts:  Yes.  without intent/plan- at this time denies any plan or intention of hurting self and contracts for safety on the unit   Homicidal Thoughts:  No  Memory:  recent and remote grossly intact   Judgement:  Impaired  Insight:  Lacking  Psychomotor Activity:  slight psychomotor restlessness but no overt tremors or agitation  Concentration:  Good  Recall:  Good  Fund of Knowledge:Good  Language: Good  Akathisia:  Negative  Handed:  Right  AIMS (if indicated):     Assets:  Housing Physical Health Resilience  Sleep:  Number of Hours: 3.25  Cognition: WNL  ADL's:  Impaired     COGNITIVE FEATURES THAT CONTRIBUTE TO RISK:  Closed-mindedness    SUICIDE RISK:  Moderate:  Frequent suicidal ideation with limited intensity, and duration, some specificity in terms of plans, no associated intent, good self-control,  limited dysphoria/symptomatology, some risk factors present, and identifiable protective factors, including available and accessible social support.  PLAN OF CARE: Patient will be admitted to inpatient psychiatric unit for stabilization and safety. Will provide and encourage milieu participation. Provide medication management and maked adjustments as needed.  Will also provide medication management to minimize risk of BZD withdrawal.  Will follow daily.    Medical Decision Making:  Review of Psycho-Social Stressors (1), Review or order clinical lab tests (1), Established Problem, Worsening (2) and Review of New Medication or Change in Dosage (2)  I certify that inpatient services furnished can reasonably be expected to improve the patient's condition.   COBOS, FERNANDO 01/09/2015, 12:24 PM

## 2015-01-09 NOTE — BHH Group Notes (Signed)
B[facility] Group Notes:  (Nursing/MHT/Case Management/Adjunct)  Date:  01/09/2015  Time:  0900 am  Type of Therapy:  Psychoeducational Skills  Participation Level:  Minimal  Participation Quality:  Inattentive  Affect:  Blunted and Flat  Cognitive:  Alert  Insight:  Lacking  Engagement in Group:  Limited  Modes of Intervention:  Support  Summary of Progress/Problems: Patient states his name was Jeramy during group.  When asked if this is what he goes by, he states "no I am just [employer] GroupJeremy."    Anikin Prosser, Cresenciano GenreCaroline Evans 01/09/2015, 9:35 AM

## 2015-01-09 NOTE — Progress Notes (Signed)
Informed charge nurse regarding comments patient made threatening to harm himself.  Agreed that MD will be notified of same and go from there.  Awaiting Dr. Dub MikesLugo to inform him of same.  Patient is safe at this time.

## 2015-01-09 NOTE — H&P (Signed)
Psychiatric Admission Assessment Adult  Patient Identification: Nicholas [name] MRN:  [id number] Date of Evaluation:  01/09/2015 Chief Complaint:  MDD Principal Diagnosis: <principal problem not specified> Diagnosis:   Patient Active Problem List   Diagnosis Date Noted  . Polysubstance dependence including opioid type drug without complication, episodic abuse [F11.220] 01/08/2015  . Polysubstance dependence including opioid type drug, continuous use [F11.229] 03/18/2014  . Substance induced mood disorder [F19.94] 03/18/2014  . Opiate dependence [F11.20] 03/17/2014  . 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 Admitted to [facility] from the [facility] with complaints of suicidal thoughts. Although, given no specific plan, Patient was not able to contract for safety. Report indicated that he said if he was discharged from the ED, he will go home & harm himself.   Nicholas [name] reports, "I was really doing well academically just last year, then things got really bad for me. I got charged with stealing. Me & my folks were in a store, then, this woman out of no where came & accused me of stealing. It was a pack of Z-quill, Ginko & 5HT, sleep aid stuff. This happened back on December 23rd of 2015. The police were called, I was charged, but I pleaded not guilty. Well, the hearing was yesterday, it did not go in my favor. It was because of this case that made me to not go back to school. I have this wrap sheet of being arrested. It makes me mad & infuriated. I have been very depressed since December of 2015. I stopped doing anything, self isolating behavior like being in my room all day watching TV. All I want now is; get me discharged and have my record expunged".    Nicholas [name] was in this hospital April, 2015 per his reports for opioid dependence. He says he was sent to [facility] after discharge to continue substance abuse treatment. Was at [facility]  x 4 days, then saw a vision of the cross in the sky. That was the clue he was seeking from God all along. After that, decided to leave [facility] after 4 days. Nicholas [name] says he has been tried on; Risperdal & Zyprexa in the past, He is currently endorsing suicide thoughts without plans to hurt himself. He is also endorsing seeing his shoe this am turn into a dog that was talking to the 3 stooges. He also says he saw the raised land outside of his window breath in this am. He is endorsing daily mood swings, insomnia. Nicholas [name] sees Dr. Vallarie Mare and currently being prescribed Xanax 1 mg prn. Last use was Wednesday of last week. He is not presenting with any substance withdrawal symptoms. He smokes weed twice daily. 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 He does not want to be on any antidepressants.  Elements:  Location:  Polysubstance dependence. Quality:  Anger, frustrations, suicidal ideations, suicide attempt. Severity:  Severe. Timing:  Started yesterday. Duration:  "I have been depressed for a while". Context:  "I went to court yesterday for charges made against me for stealing, I pleaded not guity, but it did not go in my favor, I became very depressed & suicidal".  Associated Signs/Symptoms:  Depression Symptoms:  depressed mood, hopelessness, suicidal thoughts without plan, anxiety, insomnia, loss of energy/fatigue,  (Hypo) Manic Symptoms:  Delusions, Hallucinations, Impulsivity, Irritable Mood, Labiality of Mood,  Anxiety  Symptoms:  Excessive Worry,  Psychotic Symptoms:  Hallucinations: Visual  PTSD Symptoms: NA  Total Time spent with patient: 1 hour  Past Medical History:  Past Medical History  Diagnosis Date  . Migraine     Past Surgical History  Procedure Laterality Date  . Appendectomy     Family History: History reviewed. No pertinent family history. Social History:  History  Alcohol Use No     History  Drug  Use  . Yes  . Special: Marijuana, Cocaine, Benzodiazepines, Oxycodone, Hydrocodone    Comment: opiates, pills    History   Social History  . Marital Status: Single    Spouse Name: N/A  . Number of Children: N/A  . Years of Education: N/A   Social History Main Topics  . Smoking status: Never Smoker   . Smokeless tobacco: Not on file  . Alcohol Use: No  . Drug Use: Yes    Special: Marijuana, Cocaine, Benzodiazepines, Oxycodone, Hydrocodone     Comment: opiates, pills  . Sexual Activity: Yes   Other Topics Concern  . None   Social History Narrative   Additional Social History:  Musculoskeletal: Strength & Muscle Tone: within normal limits Gait & Station: normal Patient leans: N/A  Psychiatric Specialty Exam: Physical Exam  Constitutional: He appears well-developed.  HENT:  Head: Normocephalic.  Eyes: Pupils are equal, round, and reactive to light.  Neck: Normal range of motion.  Cardiovascular: Normal rate.   Respiratory: Effort normal.  GI: Soft.  Genitourinary:  Denies any issues in this areas  Musculoskeletal: Normal range of motion.  Neurological: He is alert.  Skin: Skin is warm.  Psychiatric: His speech is normal. His mood appears anxious. His affect is angry, labile and inappropriate. His affect is not blunt. He is actively hallucinating (Seeing the land outside his room breath, saw a his shoe turn into a dog who was talking with the Stoogies). Thought content is delusional. Cognition and memory are normal. He expresses impulsivity. He exhibits a depressed mood.    Review of Systems  Constitutional: Negative.   HENT: Negative.   Eyes: Negative.   Respiratory: Negative.   Cardiovascular: Negative.   Gastrointestinal: Negative.   Genitourinary: Negative.   Musculoskeletal: Positive for myalgias and back pain.  Skin: Negative.   Neurological: Negative.   Endo/Heme/Allergies: Negative.   Psychiatric/Behavioral: Positive for depression (Rates #9),  suicidal ideas (Denies any intent or plans), hallucinations (Saw the raised land area outside his room breath, says his shoe turned into  a dog talikng  with the stoogies) and substance abuse (Polysubstance dependence). The patient is nervous/anxious and has insomnia.     Blood pressure 124/67, pulse 83, temperature 97.7 F (36.5 C), temperature source Oral, resp. rate 16, height 6' 1.5" (1.867 m), weight 103.874 kg (229 lb).Body mass index is 29.8 kg/(m^2).  General Appearance: Casual  Eye Contact::  Fair  Speech:  Clear and Coherent  Volume:  Normal  Mood:  Anxious, Depressed and Irritable  Affect:  Restricted  Thought Process:  Coherent  Orientation:  Full (Time, Place, and Person)  Thought Content:  Hallucinations: Visual and Rumination  Suicidal Thoughts:  Yes.  without intent/plan  Homicidal Thoughts:  No  Memory:  Immediate;   Good Recent;   Good Remote;   Good  Judgement:  Poor  Insight:  Lacking  Psychomotor Activity:  Restlessness  Concentration:  Fair  Recall:  [place] of Knowledge:Fair  Language: Good  Akathisia:  No  Handed:  Right  AIMS (if indicated):     Assets:  Desire for Improvement  ADL's:  Intact  Cognition: WNL  Sleep:  Number of Hours: 3.25   Risk to Self: Is patient at risk for suicide?: Yes  Risk to Others: Arseniy denies  Prior Inpatient Therapy: Yes  Prior Outpatient Therapy: Yes Alcohol Screening: 1. How often do you have a drink containing alcohol?: Monthly or less 2. How many drinks containing alcohol do you have on a typical day when you are drinking?: 1 or 2 3. How often do you have six or more drinks on one occasion?: Never Preliminary Score: 0 9. Have you or someone else been injured as a result of your drinking?: No 10. Has a relative or friend or a doctor or another health worker been concerned about your drinking or suggested you cut down?: No Alcohol Use Disorder Identification Test Final Score (AUDIT): 1 Brief Intervention: Patient  declined brief intervention  Allergies:  No Known Allergies Lab Results:  Results for orders placed or performed during the hospital encounter of 01/08/15 (from the past 48 hour(s))  TSH     Status: None   Collection Time: 01/09/15  6:35 AM  Result Value Ref Range   TSH 0.563 0.350 - 4.500 uIU/mL    Comment: Performed at [facility]  Lipid panel     Status: Abnormal   Collection Time: 01/09/15  6:35 AM  Result Value Ref Range   Cholesterol 176 0 - 200 mg/dL   Triglycerides 123 <150 mg/dL   HDL 41 >39 mg/dL   Total CHOL/HDL Ratio 4.3 RATIO   VLDL 25 0 - 40 mg/dL   LDL Cholesterol 110 (H) 0 - 99 mg/dL    Comment:        Total Cholesterol/HDL:CHD Risk Coronary Heart Disease Risk Table                     Men   Women  1/2 Average Risk   3.4   3.3  Average Risk       5.0   4.4  2 X Average Risk   9.6   7.1  3 X Average Risk  23.4   11.0        Use the calculated Patient Ratio above and the CHD Risk Table to determine the patient's CHD Risk.        ATP III CLASSIFICATION (LDL):  <100     mg/dL   Optimal  100-129  mg/dL   Near or Above                    Optimal  130-159  mg/dL   Borderline  160-189  mg/dL   High  >190     mg/dL   Very High Performed at [facility]   Carbamazepine level, total     Status: Abnormal   Collection Time: 01/09/15  6:35 AM  Result Value Ref Range   Carbamazepine Lvl <2.0 (L) 4.0 - 12.0 ug/mL    Comment: Performed at [facility]   Current Medications: Current Facility-Administered Medications  Medication Dose Route Frequency Provider Last Rate Last Dose  . acetaminophen (TYLENOL) tablet 650 mg  650 mg Oral Q6H PRN Laverle Hobby, PA-C      . alum & mag hydroxide-simeth (MAALOX/MYLANTA) 200-200-20 MG/5ML suspension 30 mL  30 mL Oral Q4H PRN Laverle Hobby, PA-C      . cloNIDine (CATAPRES) tablet 0.1 mg  0.1 mg  Oral QID Laverle Hobby, PA-C   0.1 mg at 01/08/15 2352   Followed by  . [START ON 01/11/2015] cloNIDine  (CATAPRES) tablet 0.1 mg  0.1 mg Oral BH-qamhs Spencer E Simon, PA-C       Followed by  . [START ON 01/14/2015] cloNIDine (CATAPRES) tablet 0.1 mg  0.1 mg Oral QAC breakfast Laverle Hobby, PA-C      . dicyclomine (BENTYL) tablet 20 mg  20 mg Oral Q6H PRN Laverle Hobby, PA-C      . diphenhydrAMINE (BENADRYL) capsule 50 mg  50 mg Oral QHS,MR X 1 Spencer E Simon, PA-C   50 mg at 01/08/15 2351  . hydrOXYzine (ATARAX/VISTARIL) tablet 25 mg  25 mg Oral Q6H PRN Laverle Hobby, PA-C      . loperamide (IMODIUM) capsule 2-4 mg  2-4 mg Oral PRN Laverle Hobby, PA-C      . magnesium hydroxide (MILK OF MAGNESIA) suspension 30 mL  30 mL Oral Daily PRN Laverle Hobby, PA-C      . methocarbamol (ROBAXIN) tablet 500 mg  500 mg Oral Q8H PRN Laverle Hobby, PA-C      . naproxen (NAPROSYN) tablet 500 mg  500 mg Oral BID PRN Laverle Hobby, PA-C      . ondansetron (ZOFRAN-ODT) disintegrating tablet 4 mg  4 mg Oral Q6H PRN Laverle Hobby, PA-C       PTA Medications: Prescriptions prior to admission  Medication Sig Dispense Refill Last Dose  . carbamazepine (TEGRETOL XR) 200 MG 12 hr tablet Take 1 tablet (200 mg total) by mouth 2 (two) times daily. For mood stabilization (Patient not taking: Reported on 01/08/2015) 60 tablet 0 More than a month at Unknown time  . doxepin (SINEQUAN) 25 MG capsule Take 1 capsule (25 mg total) by mouth at bedtime as needed and may repeat dose one time if needed (insomnia). (Patient not taking: Reported on 01/08/2015) 60 capsule 0 More than a month at Unknown time   Previous Psychotropic Medications: Yes   Substance Abuse History in the last 12 months:  Yes.    Consequences of Substance Abuse: Medical Consequences:  Liver damage, Possible death by overdose Legal Consequences:  Arrests, jail time, Loss of driving privilege. Family Consequences:  Family discord, divorce and or separation.  Results for orders placed or performed during the hospital encounter of 01/08/15 (from  the past 72 hour(s))  TSH     Status: None   Collection Time: 01/09/15  6:35 AM  Result Value Ref Range   TSH 0.563 0.350 - 4.500 uIU/mL    Comment: Performed at [facility]  Lipid panel     Status: Abnormal   Collection Time: 01/09/15  6:35 AM  Result Value Ref Range   Cholesterol 176 0 - 200 mg/dL   Triglycerides 123 <150 mg/dL   HDL 41 >39 mg/dL   Total CHOL/HDL Ratio 4.3 RATIO   VLDL 25 0 - 40 mg/dL   LDL Cholesterol 110 (H) 0 - 99 mg/dL    Comment:        Total Cholesterol/HDL:CHD Risk Coronary Heart Disease Risk Table                     Men   Women  1/2 Average Risk   3.4   3.3  Average Risk       5.0   4.4  2 X Average Risk   9.6   7.1  3  X Average Risk  23.4   11.0        Use the calculated Patient Ratio above and the CHD Risk Table to determine the patient's CHD Risk.        ATP III CLASSIFICATION (LDL):  <100     mg/dL   Optimal  100-129  mg/dL   Near or Above                    Optimal  130-159  mg/dL   Borderline  160-189  mg/dL   High  >190     mg/dL   Very High Performed at [facility]   Carbamazepine level, total     Status: Abnormal   Collection Time: 01/09/15  6:35 AM  Result Value Ref Range   Carbamazepine Lvl <2.0 (L) 4.0 - 12.0 ug/mL    Comment: Performed at [facility]    Observation Level/Precautions:  15 minute checks  Laboratory:  Per ED  Psychotherapy:  Group sessions  Medications: See medication lists   Consultations:  As needed  Discharge Concerns: Mood stability, maintaining sobriety    Estimated LOS: 3-5 days  Other:     Psychological Evaluations: Yes   Treatment Plan Summary: Daily contact with patient to assess and evaluate symptoms and progress in treatment and Medication management: 1. Admit for crisis management and stabilization, estimated length of stay 3-5 days.  2. Medication management to reduce current symptoms to base line and improve the patient's overall level of functioning; continue current  detox protocols in progress.  3. Treat health problems as indicated.  4. Develop treatment plan to decrease risk of relapse upon discharge and the need for readmission.  5. Psycho-social education regarding relapse prevention and self care.  6. Health care follow up as needed for medical problems.  7. Review, reconcile, and reinstate any pertinent home medications for other health issues where appropriate. 8. Call for consults with hospitalist for any additional specialty patient care services as needed.  Medical Decision Making:  New problem, with additional work up planned, Review of Psycho-Social Stressors (1), Review or order clinical lab tests (1), Review of Medication Regimen & Side Effects (2) and Review of New Medication or Change in Dosage (2)  I certify that inpatient services furnished can reasonably be expected to improve the patient's condition.   Encarnacion Slates, PMHNP-BC 2/23/201612:08 PM  I have reviewed patient with NP as above and have met with patient. Agree with NP Note, Assessment, Plan 24 year old man, known to our service from prior psychiatric admission. History of polysubstance dependence. States he was recently doing better, and was enrolled in college . He was charged with larceny and was officially charged yesterday in court. Patient then became acutely depressed, upset, and impulsively overdosed " on like 50 percocets". Suicide attempt was impulsive and not planned out. He has a history of opiate dependence, but states was not using regularly recently, also has a history of abusing BZDs and states he was taking 2 mgrs daily of " tizolam" ( unspecified BZD he was getting via internet), and was smoking cannabis daily. At this time dysphoric, depressed, but no active SI. No current severe tremors or diaphoresis, slightly restless . Vitals stable . Will initiate Opiate and BZD detox protocols to minimize risk of significant WDL. Monitor mood and affect .

## 2015-01-09 NOTE — Tx Team (Addendum)
Initial Interdisciplinary Treatment Plan   PATIENT STRESSORS: Financial difficulties Substance abuse   PATIENT STRENGTHS: Ability for insight Average or above average intelligence General fund of knowledge Supportive family/friends   PROBLEM LIST: Problem List/Patient Goals Date to be addressed Date deferred Reason deferred Estimated date of resolution  depression  01/08/2015     anxiety 01/08/2015     Risk for suicide 01/08/2015     polysubstance abuse 01/08/2015     "I want the ability to have employer look past my criminal record" 01/08/2015                              DISCHARGE CRITERIA:  Ability to meet basic life and health needs Improved stabilization in mood, thinking, and/or behavior Reduction of life-threatening or endangering symptoms to within safe limits  PRELIMINARY DISCHARGE PLAN: Outpatient therapy Return to previous living arrangement  PATIENT/FAMIILY INVOLVEMENT: This treatment plan has been presented to and reviewed with the patient, Nicholas [name]  The patient and family have been given the opportunity to ask questions and make suggestions.  JEHU-APPIAH, Jenice Leiner K 01/09/2015, 12:28 AM

## 2015-01-09 NOTE — Progress Notes (Signed)
Patient ID: Nicholas [name], male   DOB: 12/17/1990, 24 y.o.   MRN:  D: Patient refused clonidine and offer of vistaril for his anxiety.  Patient states that he will feel like hurting himself if he can't get his "aprazalam".  He states that he was getting it at [facility].  Informed patient that he would have to speak to the provider about his medications.  Patient also states that he "hasn't been doing many drugs."  He states, "I am seeing and hearing things.  I looked at one of my shoes and it looked like one of the three stooges and the other shoe and it looked like a dog and they were talking to each other.  Patient is currently in his room lying on his bed stating that "I'm all right."   A: Continue to monitor medication management and MD orders.  Safety checks completed every 15 minutes per protocol.  Meet 1:1 with patient and discuss concerns and offer encouragement. R: Patient is being monitored closely due to comments about hurting himself.

## 2015-01-09 NOTE — Tx Team (Signed)
Interdisciplinary Treatment Plan Update (Adult)   Date: 01/09/2015   Time Reviewed: 10:47 AM  Progress in Treatment:  Attending groups: No-new to unit.  Participating in groups:  No-new to unit.  Taking medication as prescribed: Yes  Tolerating medication: Yes  Family/Significant othe contact made: Not yet. SPE required for this pt.  Patient understands diagnosis: Yes, AEB seeking treatment for SI, depression/mood instability, and for medication stabilization.  Discussing patient identified problems/goals with staff: Yes  Medical problems stabilized or resolved: Yes  Denies suicidal/homicidal ideation: Passive SI reported/pt able to contract for safety on unit.  Patient has not harmed self or Others: Yes  New problem(s) identified:  Discharge Plan or Barriers: CSW assessing for appropriate referrals at this time.  Additional comments:  Nicholas [name] is an 24 y.o. male. Pt arrived voluntarily to [facility] reporting SI. Pt denies HI. Pt denies delusions and hallucinations. Pt states that his SI began today after his court case. Pt denies SI plan. Pt states that if he goes home today he will harm himself. Pt states "My life is over" "I have nothing to live for." According to the Pt, the court case did not go in favor. Pt has been charged with [employer] larceny. Pt states that he has been charged with stealing food from [employer]. [facility] for polysubstance abuse and mood disorder. Pt denied substance abuse. Pt denied prior inpatient treatment. Pt denies current mental health treatment. Pt denies medication. Pt states he is currently depressed. Pt reports the following depressive symptoms: tearfulness, anger/irritablility, guilt, and decreased sleep. Reason for Continuation of Hospitalization: SI Mood instability/depression Medication stabilization  Estimated length of stay: 3-5 days  For review of initial/current patient goals, please see plan of care.  Attendees:   Patient:    Family:    Physician: Geoffery LyonsIrving Lugo MD 01/09/2015 10:49 AM   Nursing: Zadie RhineBritney, Beverly RN 01/09/2015 10:49 AM   Clinical Social Worker Neve Branscomb Smart, LCSWA  01/09/2015 10:49 AM   Other: Gerilyn PilgrimQuylle H. LCSWEarley Abide; Kristin D. LCSWA 01/09/2015 10:49 AM   Other: Darden DatesJennifer C. Nurse CM 01/09/2015 10:49 AM   Other: Liliane Badeolora Sutton, Community Care Coordinator  01/09/2015 10:49 AM   Other:    Scribe for Treatment Team:  Trula SladeHeather Smart LCSWA 01/09/2015 10:49 AM

## 2015-01-09 NOTE — Progress Notes (Signed)
Patient approached nurse's station and stated, "I think I'm going to hurt myself."  Patient is currently sitting in day room by the telephone.  Will advise charge nurse and take appropriate action.
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
## 2015-01-09 NOTE — [facility] Group Notes (Signed)
[facility] LCSW Group Therapy  01/09/2015 1:28 PM  Type of Therapy:  Group Therapy  Participation Level:  Did Not Attend-pt in room/invited to group.  Summary of Progress/Problems: MHA Speaker came to talk about his personal journey with substance abuse and addiction.   Smart, Haadi Santellan LCSWA 01/09/2015, 1:28 PM

## 2015-01-09 NOTE — Plan of Care (Signed)
Problem: Alteration in mood Goal: STG-Patient reports thoughts of self-harm to staff Outcome: Progressing Patient endorsing passive SI but contracts for safety with this RN  Problem: Diagnosis: Increased Risk For Suicide Attempt Goal: STG-Patient Will Comply With Medication Regime Outcome: Progressing Patient has been med compliant and is able to verbalize needs for prn.

## 2015-01-10 LAB — HEMOGLOBIN A1C
HEMOGLOBIN A1C: 5.3 % (ref 4.8–5.6)
Mean Plasma Glucose: 105 mg/dL

## 2015-01-10 MED ORDER — QUETIAPINE FUMARATE 50 MG PO TABS
50.0000 mg | ORAL_TABLET | Freq: Every evening | ORAL | Status: DC | PRN
Start: 2015-01-10 — End: 2015-01-11
  Administered 2015-01-10: 50 mg via ORAL
  Filled 2015-01-10: qty 1

## 2015-01-10 NOTE — Progress Notes (Signed)
Patient ID: Nicholas [name], male   DOB: 17-May-1991, 24 y.o.   MRN: [id number] D: Patient reports day was up and down but overall good. Pt report increase anxiety over his family coming tomorrow to visit. Pt reports he does not want family to see him this way. Pt denies SI/HI/AVH and pain. Pt attended evening wrap up group and Interacted appropriately with peers. Pt denies any needs or concerns.  Cooperative with assessment. No acute distressed noted at this time.   A: Met with pt 1:1. Medications administered as prescribed. Writer encouraged pt to discuss feelings. Pt encouraged to come to staff with any question or concerns.   R: Patient remains safe. He is complaint with medications and denies any adverse reaction.

## 2015-01-10 NOTE — Progress Notes (Signed)
Adult Psychoeducational Group Note  Date:  01/10/2015 Time:  11:22 PM   Participation Level:  Active  Additional Comments:  Pt attended tonight's AA group.  He was engaged and actively participated in group.    Arrie AranChurch, Henok Heacock J 01/10/2015, 11:22 PM

## 2015-01-10 NOTE — [facility] Group Notes (Signed)
[facility] Note   01/10/2015 9:26 AM  Participation Quality:  Pt called out of group room by MD (Dr. Jama Flavorsobos) prior to beginning of d/c planning group.  Smart, American FinancialHeather LCSWA

## 2015-01-10 NOTE — Progress Notes (Signed)
[facility] MD Progress Note  01/10/2015 1:23 PM Nicholas [name]  MRN:  [id number] Subjective:   Patient states he is doing "OK". He reports craving for cannabis. He denies any significant BZD withdrawal symptoms , such as tremors, diaphoresis, but endorses " restless legs" as part of withdrawal symptoms and vomited x 1 yesterday. No current nausea. He is denying any current medication side effects. Objective : I have discussed case with treatment team and have met with patient. Behavior on unit in control , some milieu participation. Patient has limited insight regarding the severity of his substance abuse , and acknowledges his motivation to stop, particularly cannabis, is limited at this time. However, as session progressed and we discussed negative impact substance abuse has had on his life, he did states he knew he had a problem and needed to address it. States he has chronic insomnia, and this is a frequent reason why he uses drugs . We discussed medication strategies for insomnia- states Seroquel has been the most effective as it has helped him sleep well and also " not feel anxious and like my mind can't shout off ". He does not remember having had any side effects from this medication. We discussed side effects , to include  potential for metabolic disturbances and weight gain. Denies depression or sadness, minimizes neuro-vegetative symptoms of depression at this time. Lipid panel , HgbA1C, TSH - WNL Principal Problem: Polysubstance Dependence  Diagnosis:   Patient Active Problem List   Diagnosis Date Noted  . Suicide attempt by drug ingestion [T50.902A]   . Polysubstance dependence including opioid type drug without complication, episodic abuse [F11.220] 01/08/2015  . Polysubstance dependence including opioid type drug, continuous use [F11.229] 03/18/2014  . Substance induced mood disorder [F19.94] 03/18/2014  . Opiate dependence [F11.20] 03/17/2014  . 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 Migraine     Past Surgical History  Procedure Laterality Date  . Appendectomy     Family History: History reviewed. No pertinent family history. Social History:  History  Alcohol Use No     History  Drug Use  . Yes  . Special: Marijuana, Cocaine, Benzodiazepines, Oxycodone, Hydrocodone    Comment: opiates, pills    History   Social History  . Marital Status: Single    Spouse Name: N/A  . Number of Children: N/A  . Years of Education: N/A   Social History Main Topics  . Smoking status: Never Smoker   . Smokeless tobacco: Not on file  . Alcohol Use: No  . Drug Use: Yes    Special: Marijuana, Cocaine, Benzodiazepines, Oxycodone, Hydrocodone     Comment: opiates, pills  . Sexual Activity: Yes   Other Topics Concern  . None   Social History Narrative   Additional History:    Sleep: Fair  Appetite:  Good   Assessment:   Musculoskeletal: Strength & Muscle Tone: within normal limits- no significant tremors or diaphoresis noted thus far  Gait & Station: normal Patient leans: N/A   Psychiatric Specialty Exam: Physical Exam  Review of Systems  Constitutional: Negative for fever and chills.  Respiratory: Negative for cough and shortness of breath.   Cardiovascular: Negative for chest pain.  Gastrointestinal: Positive for vomiting.  Genitourinary: Negative for dysuria, urgency and frequency.  Skin: Negative for rash.  Neurological: Positive for tingling. Negative for headaches.  Described as " restless legs"  Psychiatric/Behavioral: Positive for substance abuse. The patient has insomnia.     Blood pressure 156/95, pulse 82, temperature 97.3 F (36.3 C), temperature source Oral, resp. rate 18, height 6' 1.5" (1.867 m), weight 229 lb (103.874 kg), SpO2 99 %.Body mass index is 29.8 kg/(m^2).  General Appearance: Fairly Groomed  Chemical engineer::  Good  Speech:  Normal Rate  Volume:  Normal  Mood:  minimkzes depression, does appear mildly constricted in affect   Affect:  Constricted and but reactive   Thought Process:  Goal Directed and Linear  Orientation:  Full (Time, Place, and Person)  Thought Content:  denies hallucinations, no delusions, does not appear internally preoccupied   Suicidal Thoughts:  No at this time denies any thoughts of hurting self and  contracts for safety on unit   Homicidal Thoughts:  No  Memory: Recent and Remote grossly intact  Judgement:  Fair  Insight:  Fair  Psychomotor Activity:  Normal  Concentration:  Good  Recall:  Good  Fund of Knowledge:Good  Language: Good  Akathisia:  Negative  Handed:  Right  AIMS (if indicated):     Assets:  Communication Skills Desire for Improvement Physical Health Resilience  ADL's:  improving  Cognition: WNL  Sleep:  Number of Hours: 4.75     Current Medications: Current Facility-Administered Medications  Medication Dose Route Frequency Provider Last Rate Last Dose  . acetaminophen (TYLENOL) tablet 650 mg  650 mg Oral Q6H PRN Laverle Hobby, PA-C   650 mg at 01/09/15 1815  . alum & mag hydroxide-simeth (MAALOX/MYLANTA) 200-200-20 MG/5ML suspension 30 mL  30 mL Oral Q4H PRN Laverle Hobby, PA-C      . cloNIDine (CATAPRES) tablet 0.1 mg  0.1 mg Oral QID Laverle Hobby, PA-C   0.1 mg at 01/10/15 1158   Followed by  . [START ON 01/11/2015] cloNIDine (CATAPRES) tablet 0.1 mg  0.1 mg Oral BH-qamhs Spencer E Simon, PA-C       Followed by  . [START ON 01/14/2015] cloNIDine (CATAPRES) tablet 0.1 mg  0.1 mg Oral QAC breakfast Laverle Hobby, PA-C      . dicyclomine (BENTYL) tablet 20 mg  20 mg Oral Q6H PRN Laverle Hobby, PA-C      . loperamide (IMODIUM) capsule 2-4 mg  2-4 mg Oral PRN Jenne Campus, MD      . LORazepam (ATIVAN) tablet 1 mg  1 mg Oral Q6H PRN Jenne Campus, MD   1 mg at 01/09/15 1512  . LORazepam (ATIVAN) tablet 1 mg  1 mg Oral  TID Jenne Campus, MD   1 mg at 01/10/15 1158   Followed by  . [START ON 01/11/2015] LORazepam (ATIVAN) tablet 1 mg  1 mg Oral BID Jenne Campus, MD       Followed by  . [START ON 01/12/2015] LORazepam (ATIVAN) tablet 1 mg  1 mg Oral Daily Jenne Campus, MD   1 mg at 01/10/15 0817  . magnesium hydroxide (MILK OF MAGNESIA) suspension 30 mL  30 mL Oral Daily PRN Laverle Hobby, PA-C      . methocarbamol (ROBAXIN) tablet 500 mg  500 mg Oral Q8H PRN Laverle Hobby, PA-C   500 mg at 01/10/15 0010  . multivitamin with minerals tablet 1 tablet  1 tablet Oral Daily Jenne Campus, MD   1 tablet at 01/10/15 0816  . naproxen (NAPROSYN) tablet 500 mg  500 mg  Oral BID PRN Laverle Hobby, PA-C   500 mg at 01/09/15 1512  . ondansetron (ZOFRAN-ODT) disintegrating tablet 4 mg  4 mg Oral Q6H PRN Jenne Campus, MD   4 mg at 01/10/15 1159  . QUEtiapine (SEROQUEL) tablet 50 mg  50 mg Oral QHS PRN Jenne Campus, MD      . thiamine (VITAMIN B-1) tablet 100 mg  100 mg Oral Daily Jenne Campus, MD   100 mg at 01/10/15 7262    Lab Results:  Results for orders placed or performed during the hospital encounter of 01/08/15 (from the past 48 hour(s))  TSH     Status: None   Collection Time: 01/09/15  6:35 AM  Result Value Ref Range   TSH 0.563 0.350 - 4.500 uIU/mL    Comment: Performed at [facility]  Hemoglobin A1c     Status: None   Collection Time: 01/09/15  6:35 AM  Result Value Ref Range   Hgb A1c MFr Bld 5.3 4.8 - 5.6 %    Comment: (NOTE)         Pre-diabetes: 5.7 - 6.4         Diabetes: >6.4         Glycemic control for adults with diabetes: <7.0    Mean Plasma Glucose 105 mg/dL    Comment: (NOTE) Performed At: [facility] [place], [place] [id number] Lindon Romp MD LA:[id number] Performed at [facility]   Lipid panel     Status: Abnormal   Collection Time: 01/09/15  6:35 AM  Result Value Ref Range   Cholesterol 176 0 - 200  mg/dL   Triglycerides 123 <150 mg/dL   HDL 41 >39 mg/dL   Total CHOL/HDL Ratio 4.3 RATIO   VLDL 25 0 - 40 mg/dL   LDL Cholesterol 110 (H) 0 - 99 mg/dL    Comment:        Total Cholesterol/HDL:CHD Risk Coronary Heart Disease Risk Table                     Men   Women  1/2 Average Risk   3.4   3.3  Average Risk       5.0   4.4  2 X Average Risk   9.6   7.1  3 X Average Risk  23.4   11.0        Use the calculated Patient Ratio above and the CHD Risk Table to determine the patient's CHD Risk.        ATP III CLASSIFICATION (LDL):  <100     mg/dL   Optimal  100-129  mg/dL   Near or Above                    Optimal  130-159  mg/dL   Borderline  160-189  mg/dL   High  >190     mg/dL   Very High Performed at [facility]   Carbamazepine level, total     Status: Abnormal   Collection Time: 01/09/15  6:35 AM  Result Value Ref Range   Carbamazepine Lvl <2.0 (L) 4.0 - 12.0 ug/mL    Comment: Performed at [facility]    Physical Findings: AIMS: Facial and Oral Movements Muscles of Facial Expression: None, normal Lips and Perioral Area: None, normal Jaw: None, normal Tongue: None, normal,Extremity Movements Upper (arms, wrists, hands, fingers): None, normal Lower (legs, knees, ankles, toes): None,  normal, Trunk Movements Neck, shoulders, hips: None, normal, Overall Severity Severity of abnormal movements (highest score from questions above): None, normal Incapacitation due to abnormal movements: None, normal Patient's awareness of abnormal movements (rate only patient's report): No Awareness, Dental Status Current problems with teeth and/or dentures?: No Does patient usually wear dentures?: No  CIWA:  CIWA-Ar Total: 2 COWS:  COWS Total Score: 0   Assessment- patient improved, and currently minimizes depression. No SI or psychotic symptoms. He is presenting with some WDL, and symptoms such as nausea,  Episode of vomiting , and  " restless legs" are more suggestive of  opiate WDL than BZD WDL at this time. Insomnia identified as a chronic issue, and states Seroquel has been helpful. Insight into substance dependence limited but may be improving .  Treatment Plan Summary: Daily contact with patient to assess and evaluate symptoms and progress in treatment, Medication management, Plan continue inpatient treatment  and continue medications as below  Continue Opiate Detox  with CLonidine Protocol Continue BZD detox  with Ativan  Protocol Start Seroquel 50 mgrs QHS- patient prefers PRN rather than standing dosing Patient expressing interest in being screened for HIV, Hep B and C. Will follow   Medical Decision Making:  Established Problem, Stable/Improving (1), Review of Psycho-Social Stressors (1), Review or order clinical lab tests (1), Review of Last Therapy Session (1) and Review of New Medication or Change in Dosage (2)     Iliani Vejar 01/10/2015, 1:23 PM

## 2015-01-10 NOTE — Plan of Care (Signed)
Problem: Ineffective individual coping Goal: STG: Patient will remain free from self harm Outcome: Progressing Patient denying SI today and has not engaged in self harm.  Problem: Alteration in mood; excessive anxiety as evidenced by: Goal: STG-Pt can identify coping skills to manage panic/anxiety (Patient can identify at least ____ coping skills to manage panic/anxiety attack)  Outcome: Progressing Patient displaying insight into his need to be here. Yesterday was demanding discharge. Today more open to learning ways to maintain sobriety, manage depression and SI

## 2015-01-10 NOTE — Progress Notes (Signed)
Patient doing better today, displaying some insight into need for admit. No longer asking to leave nor trying front unit doors to see if unlocked. Behavior more appropriate. Withdrawal is minimal (see doc flowsheets) though did request something for nausea prior to lunch. Affect is flat with congruent mood. Patient medicated per orders. Given zofran for nausea. Patient offered support. While patient had endorsed passive SI prior, he is able to deny today. Contracts verbally with this writer should that change. Denies HI/AVH and remains safe. Nicholas [name], Nicholas [name]
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
## 2015-01-11 LAB — HEPATITIS C ANTIBODY: HCV AB: NEGATIVE

## 2015-01-11 LAB — HEPATITIS B SURFACE ANTIGEN: HEP B S AG: NEGATIVE

## 2015-01-11 LAB — HIV ANTIBODY (ROUTINE TESTING W REFLEX): HIV SCREEN 4TH GENERATION: NONREACTIVE

## 2015-01-11 MED ORDER — ROPINIROLE HCL 0.25 MG PO TABS
0.2500 mg | ORAL_TABLET | Freq: Every day | ORAL | Status: DC
  Administered 2015-01-11 – 2015-01-12 (×2): 0.25 mg via ORAL
  Filled 2015-01-11 (×3): qty 1
  Filled 2015-01-11 (×2): qty 14
  Filled 2015-01-11 (×2): qty 1

## 2015-01-11 MED ORDER — QUETIAPINE FUMARATE 25 MG PO TABS
25.0000 mg | ORAL_TABLET | Freq: Every evening | ORAL | Status: DC | PRN
  Administered 2015-01-11 – 2015-01-14 (×3): 25 mg via ORAL
  Filled 2015-01-11 (×3): qty 1
  Filled 2015-01-11: qty 14

## 2015-01-11 NOTE — [facility] Group Notes (Signed)
[facility] Group Notes:  (Nursing/MHT/Case Management/Adjunct)  Date:  01/11/2015  Time:  0900  Type of Therapy:  Nurse Education  Participation Level:  Did Not Attend  Participation Quality:    Affect:    Cognitive:    Insight:    Engagement in Group:    Modes of Intervention:    Summary of Progress/Problems:Goals/ Leisure and Lifestyle changes  Andres Egeritchett, Gaddiel Cullens Hundley 01/11/2015, 10:27 AM

## 2015-01-11 NOTE — Progress Notes (Signed)
The patient attended this evening's Karaoke group and was appropriate.  

## 2015-01-11 NOTE — Progress Notes (Signed)
Recreation Therapy Notes  Animal-Assisted Activity/Therapy (AAA/T) Program Checklist/Progress Notes Patient Eligibility Criteria Checklist & Daily Group note for Rec Tx Intervention  Date:  02.25.2016 Time: 2:45pm Location: [address] Dayroom    AAA/T Program Assumption of Risk Form signed by Patient/ or Parent Legal Guardian yes  Patient is free of allergies or sever asthma yes  Patient reports no fear of animals yes  Patient reports no history of cruelty to animals yes  Patient understands his/her participation is voluntary yes  Behavioral Response: Did not attend.    Jaymi Tinner L Jennel Mara, LRT/CTRS  Laquincy Eastridge L 01/11/2015 4:39 PM 

## 2015-01-11 NOTE — Progress Notes (Signed)
Patient ID: Nicholas [name], male   DOB: 1991-05-14, 24 y.o.   MRN: [id number] D: Patient reports day was up and down but overall good. Pt report parents were not able to visit but drop off clothes. Pt reports decrease in anxiety levels.  Pt denies SI/HI/AVH and pain. Pt attended evening AA group and engaged in discussion. Pt denies any needs or concerns.  Cooperative with assessment. No acute distressed noted at this time.   A: Met with pt 1:1. Medications administered as prescribed. Writer encouraged pt to discuss feelings. Pt encouraged to come to staff with any question or concerns.   R: Patient remains safe. He is complaint with medications and denies any adverse reaction.

## 2015-01-11 NOTE — Progress Notes (Signed)
Patient ID: Nicholas [name], male   DOB: 1991/10/18, 24 y.o.   MRN: [id number] [facility] MD Progress Note  01/11/2015 3:55 PM Nicholas [name]  MRN:  [id number] Subjective:   Patient reports he is doing " all right I guess". He is not endorsing any severe withdrawal symptoms at this time, but does continue to report a subjective sense of " restless legs", which he describes as a need to move his legs at night time in particular. Even though he has had more of this symptom recently , in the context of withdrawal, states he has had this before, and states his mother has been diagnosed with Restless Leg Syndrome. States " I think I might have it too". Does not think it is associated with Seroquel trial, as he had it before he started this medication. As noted, has reported long term insomnia, and states Seroquel has been most effective medication to address this thus far. He is denying any current medication side effects. Objective : I have discussed case with treatment team and have met with patient. Behavior on unit in control , some milieu participation. Patient presents with gradually improving mood and affect. He is currently not reporting any significant neuro-vegetative symptom of depression and states he is feeling better about himself. He is more amenable to discuss substance abuse issues and negative impact this has had on his life and importance of getting treatment for substance abuse as an outpatient. I have encouraged him to consider an inpatient rehab, but he is reluctant, staying he prefers to go home after discharge. He does express interest in going to [place] or NA meetings, and we reviewed the importance of avoiding people, places , and situations he associates with drug use in order to decrease risk of relapse. Agrees to Requip trial to address restless leg symptoms.  Tolerating detox protocols well and at this time not exhibiting any significant withdrawal symptoms, PO intake is good and there have  been no further episodes of vomiting. Hep B/C and HIV screening negative as below. Principal Problem: Polysubstance Dependence  Diagnosis:   Patient Active Problem List   Diagnosis Date Noted  . Suicide attempt by drug ingestion [T50.902A]   . Polysubstance dependence including opioid type drug without complication, episodic abuse [F11.220] 01/08/2015  . Polysubstance dependence including opioid type drug, continuous use [F11.229] 03/18/2014  . Substance induced mood disorder [F19.94] 03/18/2014  . Opiate dependence [F11.20] 03/17/2014  . 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 Migraine     Past Surgical History  Procedure Laterality Date  . Appendectomy     Family History: History reviewed. No pertinent family history. Social History:  History  Alcohol Use No     History  Drug Use  . Yes  . Special: Marijuana, Cocaine, Benzodiazepines, Oxycodone, Hydrocodone    Comment: opiates, pills    History   Social History  . Marital Status: Single    Spouse Name: N/A  . Number of Children: N/A  . Years of Education: N/A   Social History Main Topics  . Smoking status: Never Smoker   . Smokeless tobacco: Not on file  . Alcohol Use: No  . Drug Use: Yes    Special: Marijuana, Cocaine, Benzodiazepines, Oxycodone, Hydrocodone     Comment: opiates, pills  . Sexual Activity: Yes   Other Topics Concern  . None   Social  History Narrative   Additional History:    Sleep: Fair  Appetite:  Good   Assessment:   Musculoskeletal: Strength & Muscle Tone: within normal limits- no significant tremors or diaphoresis noted thus far  Gait & Station: normal Patient leans: N/A   Psychiatric Specialty Exam: Physical Exam  Review of Systems  Constitutional: Negative for fever and chills.  Respiratory: Negative for cough and shortness of breath.    Cardiovascular: Negative for chest pain.  Gastrointestinal: Negative for vomiting, abdominal pain and diarrhea.  Genitourinary: Negative for dysuria, urgency and frequency.  Skin: Negative for rash.  Neurological: Negative for headaches.       Restless legs at night- see above   Psychiatric/Behavioral: Positive for substance abuse.    Blood pressure 133/76, pulse 79, temperature 99 F (37.2 C), temperature source Oral, resp. rate 16, height 6' 1.5" (1.867 m), weight 229 lb (103.874 kg), SpO2 99 %.Body mass index is 29.8 kg/(m^2).  General Appearance: Well Groomed  Engineer, water::  Good  Speech:  Normal Rate  Volume:  Normal  Mood:  improved mood  Affect:  fuller range of affect  Thought Process:  Goal Directed and Linear  Orientation:  Full (Time, Place, and Person)  Thought Content:  denies hallucinations, no delusions, does not appear internally preoccupied   Suicidal Thoughts:  No at this time denies any thoughts of hurting self and  contracts for safety on unit   Homicidal Thoughts:  No  Memory: Recent and Remote grossly intact  Judgement:  Fair  Insight:  Fair  Psychomotor Activity:  Normal  Concentration:  Good  Recall:  Good  Fund of Knowledge:Good  Language: Good  Akathisia:  Negative  Handed:  Right  AIMS (if indicated):     Assets:  Communication Skills Desire for Improvement Physical Health Resilience  ADL's:  improving  Cognition: WNL  Sleep:  Number of Hours: 4.75     Current Medications: Current Facility-Administered Medications  Medication Dose Route Frequency Provider Last Rate Last Dose  . acetaminophen (TYLENOL) tablet 650 mg  650 mg Oral Q6H PRN Laverle Hobby, PA-C   650 mg at 01/09/15 1815  . alum & mag hydroxide-simeth (MAALOX/MYLANTA) 200-200-20 MG/5ML suspension 30 mL  30 mL Oral Q4H PRN Laverle Hobby, PA-C      . cloNIDine (CATAPRES) tablet 0.1 mg  0.1 mg Oral BH-qamhs Laverle Hobby, PA-C       Followed by  . [START ON 01/14/2015]  cloNIDine (CATAPRES) tablet 0.1 mg  0.1 mg Oral QAC breakfast Laverle Hobby, PA-C      . dicyclomine (BENTYL) tablet 20 mg  20 mg Oral Q6H PRN Laverle Hobby, PA-C      . loperamide (IMODIUM) capsule 2-4 mg  2-4 mg Oral PRN Jenne Campus, MD      . LORazepam (ATIVAN) tablet 1 mg  1 mg Oral Q6H PRN Jenne Campus, MD   1 mg at 01/09/15 1512  . LORazepam (ATIVAN) tablet 1 mg  1 mg Oral BID Jenne Campus, MD   1 mg at 01/11/15 1443   Followed by  . [START ON 01/12/2015] LORazepam (ATIVAN) tablet 1 mg  1 mg Oral Daily Jenne Campus, MD   1 mg at 01/10/15 0817  . magnesium hydroxide (MILK OF MAGNESIA) suspension 30 mL  30 mL Oral Daily PRN Laverle Hobby, PA-C      . methocarbamol (ROBAXIN) tablet 500 mg  500 mg Oral Q8H PRN Maurine Minister  Simon, PA-C   500 mg at 01/10/15 1943  . multivitamin with minerals tablet 1 tablet  1 tablet Oral Daily Jenne Campus, MD   1 tablet at 01/11/15 0263  . naproxen (NAPROSYN) tablet 500 mg  500 mg Oral BID PRN Laverle Hobby, PA-C   500 mg at 01/10/15 2232  . ondansetron (ZOFRAN-ODT) disintegrating tablet 4 mg  4 mg Oral Q6H PRN Jenne Campus, MD   4 mg at 01/11/15 1114  . QUEtiapine (SEROQUEL) tablet 50 mg  50 mg Oral QHS PRN Jenne Campus, MD   50 mg at 01/10/15 2136  . thiamine (VITAMIN B-1) tablet 100 mg  100 mg Oral Daily Jenne Campus, MD   100 mg at 01/11/15 7858    Lab Results:  Results for orders placed or performed during the hospital encounter of 01/08/15 (from the past 48 hour(s))  Hepatitis B surface antigen     Status: None   Collection Time: 01/11/15  6:30 AM  Result Value Ref Range   Hepatitis B Surface Ag NEGATIVE NEGATIVE    Comment: Performed at [facility]  HIV antibody     Status: None   Collection Time: 01/11/15  6:30 AM  Result Value Ref Range   HIV Screen 4th Generation wRfx Non Reactive Non Reactive    Comment: (NOTE) Performed At: [facility] [place], [place] [id number] Lindon Romp MD IN:[id number] Performed at [facility]   Hepatitis C antibody     Status: None   Collection Time: 01/11/15  6:30 AM  Result Value Ref Range   HCV Ab NEGATIVE NEGATIVE    Comment: Performed at [facility]    Physical Findings: AIMS: Facial and Oral Movements Muscles of Facial Expression: None, normal Lips and Perioral Area: None, normal Jaw: None, normal Tongue: None, normal,Extremity Movements Upper (arms, wrists, hands, fingers): None, normal Lower (legs, knees, ankles, toes): None, normal, Trunk Movements Neck, shoulders, hips: None, normal, Overall Severity Severity of abnormal movements (highest score from questions above): None, normal Incapacitation due to abnormal movements: None, normal Patient's awareness of abnormal movements (rate only patient's report): No Awareness, Dental Status Current problems with teeth and/or dentures?: No Does patient usually wear dentures?: No  CIWA:  CIWA-Ar Total: 2 COWS:  COWS Total Score: 3   Assessment- patient  Continues to improve, and currently not presenting with any severe or significant withdrawal symptoms. Does report some restless leg symptoms, and states he thinks he may have actual restless leg syndrome affecting his sleep. Had these symptoms prior to Seroquel trial, and does not see any worsening on this medication thus far. Encouraged to consider inpatient rehab after discharge, but states prefers to go home.   Treatment Plan Summary: Daily contact with patient to assess and evaluate symptoms and progress in treatment, Medication management, Plan continue inpatient treatment  and continue medications as below  Continue Opiate Detox  with CLonidine Protocol Continue BZD detox  with Ativan  Protocol Decrease Seroquel  To 25 mgrs   QHS PRN Insomnia or agitation ( does decreased to minimize akathisia type symptoms)  Start Requip 0.25 mgrs QHS Will follow   Medical Decision Making:   Established Problem, Stable/Improving (1), Review of Psycho-Social Stressors (1), Review or order clinical lab tests (1), Review of Last Therapy Session (1) and Review of New Medication or Change in Dosage (2)     Montoya Watkin 01/11/2015, 3:55 PM

## 2015-01-11 NOTE — [facility] Group Notes (Addendum)
[facility] LCSW Group Therapy  01/11/2015 3:39 PM   Type of Therapy:  Group Therapy  Participation Level:  Active  Participation Quality:  Appropriate  Affect:  Appropriate  Cognitive:  Appropriate  Insight:  Developing/Improving  Engagement in Therapy:  Developing/Improving  Modes of Intervention:  Clarification, Discussion, Exploration and Problem-solving  Summary of Progress/Problems:  Finding Balance in Life. Today's group focused on defining balance in one's own words, identifying things that can knock one off balance, and exploring healthy ways to maintain balance in life. Group members were asked to provide an example of a time when they felt off balance, describe how they handled that situation,and process healthier ways to regain balance in the future. Group members were asked to share the most important tool for maintaining balance that [facility] and how they plan to apply this method after discharge.   Patient discussed incidents where he acted on impulse to shoplift, related this to recent incident where he made different decision not to steal.  Discussed use of urge surfing to overcome cravings or impulsive thoughts, becoming "one with the wave" as way to realize that his urge diminishes over time.  Santa GeneraAnne Cunningham, LCSW Clinical Social Worker    Santa Gen[place]Anne Cunningham, [place]

## 2015-01-11 NOTE — Progress Notes (Signed)
Patient ID: Nicholas [name], male   DOB: 07/06/1991, 24 y.o.   MRN: [id number]  D: Patient pleasant on approach this am. Reports depression "3', hopelessness "2", and anxiety "7" on scales with 10 being the worst. Contracts for safety on the unit. Presently on librium and clonidine protocol. Reports some cravings, agitation, runny nose, and chilling at times. VS stable.  A:Staff will monitor on q 15 minute checks, follow treatment plan, and give meds as ordered. R: Cooperative on the unit.

## 2015-01-12 DIAGNOSIS — F1123 Opioid dependence with withdrawal: Secondary | ICD-10-CM

## 2015-01-12 MED ORDER — ONDANSETRON 4 MG PO TBDP: 4.0000 mg | ORAL_TABLET | Freq: Three times a day (TID) | ORAL | Status: DC | PRN

## 2015-01-12 MED ORDER — HYDROXYZINE HCL 25 MG PO TABS
25.0000 mg | ORAL_TABLET | Freq: Four times a day (QID) | ORAL | Status: DC | PRN
Start: 2015-01-12 — End: 2015-01-15
  Administered 2015-01-12: 25 mg via ORAL
  Filled 2015-01-12: qty 20
  Filled 2015-01-12: qty 1

## 2015-01-12 NOTE — Progress Notes (Signed)
  [facility]  Will you be returning to the same living situation after discharge:  Yes,  home with parents At discharge, do you have transportation home?: Yes,  mother coming on Sun to pick up pt. D/c is SUN 2/28 per Dr. Jama Flavorsobos Do you have the ability to pay for your medications: Yes,  mental health  Release of information consent forms completed and submitted to Medical Records by CSW.  Patient to Follow up at: Follow-up Information    [facility] [place] On 01/16/2015.   Why:  Appt for hospital follow-up on this date. Please walk in between 8:30AM-10:30AM. PLEASE BE SURE TO REQUEST 1:1 THERAPY IF INTERESTED. Referral number: [id number]   Contact information:   [address] [place], [place] [address] Phone: [phone] Fax: [phone]      Patient denies SI/HI: Yes,  during group/self report.     Safety Planning and Suicide Prevention discussed: Yes,  Contact attempts made with pt's mother. SPE compelted with pt and SPI pamphlet provided to pt. He was encouraged to share this information with support network, ask questions, and talk about any concerns regarding SPE.  Have you used any form of tobacco in the last 30 days? (Cigarettes, Smokeless Tobacco, Cigars, and/or Pipes): No  Has patient been referred to the Quitline?: Pt not a tobacco user.   Smart, Valeree Leidy LCSWA  01/12/2015, 4:27 PM

## 2015-01-12 NOTE — [facility] Suicide Risk Assessment (Signed)
[facility] INPATIENT:  Family/Significant Other Suicide Prevention Education  Suicide Prevention Education:  Contact Attempts: Eartha Inchorma Weyman  has been identified by the patient as the family member/significant other with whom the patient will be residing, and identified as the person(s) who will aid the patient in the event of a mental health crisis.  With written consent from the patient, two attempts were made to provide suicide prevention education, prior to and/or following the patient's discharge.  We were unsuccessful in providing suicide prevention education.  A suicide education pamphlet was given to the patient to share with family/significant other.  Date and time of first attempt: 01/12/15 1:00PM no answer/no vm.  Date and time of second attempt: 01/12/15 3:45PM (left message for Ms. Corine ShelterWatkins to call back at earliest convenience).   Smart, Lenor Provencher LCSWA 01/12/2015, 3:44 PM

## 2015-01-12 NOTE — [facility] Group Notes (Signed)
Adult Psychoeducational Group Note  Date:  01/12/2015 Time:  1030  Group Topic/Focus:  Building Self Esteem:   The Focus of this group is helping patients become aware of the effects of self-esteem on their lives, the things they and others do that enhance or undermine their self-esteem, seeing the relationship between their level of self-esteem and the choices they make and learning ways to enhance self-esteem.  Participation Level:  Did Not Attend   Earline MayotteKnight, Payeton Germani Shephard 01/12/2015, 12:34 PM

## 2015-01-12 NOTE — Progress Notes (Signed)
Patient ID: Nicholas MarionRyan E [name], male   DOB: 03/06/1991, 24 y.o.   MRN: [id number]  D: Patient pleasant on approach today. Reports some withdrawals of runny nose, irritability, and nausea. Reports depression "3.4" and hopelessness "2.8". Patient attempting humor on self inventory sheet. Reports anxiety "6". States he will probably be discharged on Sunday. Receded his 72hr request for discharge.  A: Staff will monitor on q 15 minute checks, follow treatment plan, and give meds as ordered. R: Cooperative on the unit.

## 2015-01-12 NOTE — Progress Notes (Signed)
Patient ID: Nicholas [name], male   DOB: 09-05-1991, 24 y.o.   MRN: [id number] [facility] MD Progress Note  01/12/2015 11:29 AM Nicholas [name]  MRN:  [id number] Subjective:   Patient states he is feeling better, but is still experiencing some opiate withdrawal symptoms - he describes " muscle twitches" which he has had during prior withdrawals, and describes ongoing vague discomfort, aches, although improved. He states he is very motivated to stop using drugs, but reports he continues to have significant cravings for opiates. Expressed interest in Naltrexone as a potential medication option to address opiate dependence, but we reviewed how if started at this time it could worsen/exacerbate opiate WDL. Last used opiates 2/22. . Objective : I have discussed case with treatment team and have met with patient. Behavior on unit in control , increasing milieu participation. Although improved compared to admission, affect remains vaguely anxious. He is ambivalent about abstinence, and states " I know I have to stop using", but he also , as above, describes cravings for opiates, and a desire to use hallucinogens again, as he states he has had prior pleasurable " trips" when using these substances in the past. We discussed the importance of considering abstinence from all addictive substances as treatment goal, and reviewed treatment options such as Rehab, which I have encouraged him to consider . At this time not interested, but does state he plans to go to NA or AA regularly. Night time restless legs improved and slept better. Tolerating Requip well.  Completing Clonidine taper.  Diagnosis:   Patient Active Problem List   Diagnosis Date Noted  . Suicide attempt by drug ingestion [T50.902A]   . Polysubstance dependence including opioid type drug without complication, episodic abuse [F11.220] 01/08/2015  . Polysubstance dependence including opioid type drug, continuous use [F11.229] 03/18/2014  . Substance induced  mood disorder [F19.94] 03/18/2014  . Opiate dependence [F11.20] 03/17/2014  . 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 Migraine     Past Surgical History  Procedure Laterality Date  . Appendectomy     Family History: History reviewed. No pertinent family history. Social History:  History  Alcohol Use No     History  Drug Use  . Yes  . Special: Marijuana, Cocaine, Benzodiazepines, Oxycodone, Hydrocodone    Comment: opiates, pills    History   Social History  . Marital Status: Single    Spouse Name: N/A  . Number of Children: N/A  . Years of Education: N/A   Social History Main Topics  . Smoking status: Never Smoker   . Smokeless tobacco: Not on file  . Alcohol Use: No  . Drug Use: Yes    Special: Marijuana, Cocaine, Benzodiazepines, Oxycodone, Hydrocodone     Comment: opiates, pills  . Sexual Activity: Yes   Other Topics Concern  . None   Social History Narrative   Additional History:    Sleep: Fair  Appetite:  Good   Assessment:   Musculoskeletal: Strength & Muscle Tone: within normal limits- no significant tremors or diaphoresis noted thus far  Gait & Station: normal Patient leans: N/A   Psychiatric Specialty Exam: Physical Exam  Review of Systems  Constitutional: Negative for fever and chills.  Respiratory: Negative for cough.   Cardiovascular: Negative for chest pain.  Genitourinary: Negative for dysuria, urgency and frequency.  Skin: Negative for rash.  Neurological: Negative.  Negative for headaches.       Describes night time lower extremity dysesthesias/paresthesias.  Psychiatric/Behavioral: Positive for depression and substance abuse.    Blood pressure 133/77, pulse 81, temperature 98.9 F (37.2 C), temperature source Oral, resp. rate 16, height 6' 1.5" (1.867 m), weight 229 lb (103.874 kg), SpO2 99 %.Body mass index is  29.8 kg/(m^2).  General Appearance: Well Groomed  Engineer, water::  Good  Speech:  Normal Rate  Volume:  Normal  Mood:  improved mood- denies depression  Affect:  improved affect, but remains somewhat anxious  Thought Process:  Goal Directed and Linear  Orientation:  Full (Time, Place, and Person)  Thought Content:  denies hallucinations, no delusions, does not appear internally preoccupied   Suicidal Thoughts:  No at this time denies any thoughts of hurting self and  contracts for safety on unit   Homicidal Thoughts:  No  Memory: Recent and Remote grossly intact  Judgement:  Fair  Insight:  improving   Psychomotor Activity:  Normal  Concentration:  Good  Recall:  Good  Fund of Knowledge:Good  Language: Good  Akathisia:  Negative  Handed:  Right  AIMS (if indicated):     Assets:  Communication Skills Desire for Improvement Physical Health Resilience  ADL's:  improving  Cognition: WNL  Sleep:  Number of Hours: 5.5     Current Medications: Current Facility-Administered Medications  Medication Dose Route Frequency Provider Last Rate Last Dose  . acetaminophen (TYLENOL) tablet 650 mg  650 mg Oral Q6H PRN Laverle Hobby, PA-C   650 mg at 01/09/15 1815  . alum & mag hydroxide-simeth (MAALOX/MYLANTA) 200-200-20 MG/5ML suspension 30 mL  30 mL Oral Q4H PRN Laverle Hobby, PA-C      . cloNIDine (CATAPRES) tablet 0.1 mg  0.1 mg Oral BH-qamhs Spencer E Simon, PA-C   0.1 mg at 01/12/15 6045   Followed by  . [START ON 01/14/2015] cloNIDine (CATAPRES) tablet 0.1 mg  0.1 mg Oral QAC breakfast Laverle Hobby, PA-C      . dicyclomine (BENTYL) tablet 20 mg  20 mg Oral Q6H PRN Laverle Hobby, PA-C      . loperamide (IMODIUM) capsule 2-4 mg  2-4 mg Oral PRN Jenne Campus, MD      . LORazepam (ATIVAN) tablet 1 mg  1 mg Oral Q6H PRN Jenne Campus, MD   1 mg at 01/09/15 1512  . magnesium hydroxide (MILK OF MAGNESIA) suspension 30 mL  30 mL Oral Daily PRN Laverle Hobby, PA-C      .  methocarbamol (ROBAXIN) tablet 500 mg  500 mg Oral Q8H PRN Laverle Hobby, PA-C   500 mg at 01/11/15 2249  . multivitamin with minerals tablet 1 tablet  1 tablet Oral Daily Jenne Campus, MD   1 tablet at 01/12/15 4098  . naproxen (NAPROSYN) tablet 500 mg  500 mg Oral BID PRN Laverle Hobby, PA-C   500 mg at 01/10/15 2232  . ondansetron (ZOFRAN-ODT) disintegrating tablet 4 mg  4 mg Oral Q6H PRN Jenne Campus, MD   4 mg at 01/11/15 1114  . QUEtiapine (SEROQUEL) tablet 25 mg  25 mg Oral QHS PRN Jenne Campus, MD   25 mg at 01/11/15 2250  . rOPINIRole (REQUIP) tablet 0.25 mg  0.25 mg Oral QHS Myer Peer Nathanel Tallman, MD   0.25 mg at 01/11/15 2246  . thiamine (VITAMIN B-1) tablet 100 mg  100 mg Oral Daily Jenne Campus, MD  100 mg at 01/12/15 1975    Lab Results:  Results for orders placed or performed during the hospital encounter of 01/08/15 (from the past 48 hour(s))  Hepatitis B surface antigen     Status: None   Collection Time: 01/11/15  6:30 AM  Result Value Ref Range   Hepatitis B Surface Ag NEGATIVE NEGATIVE    Comment: Performed at [facility]  HIV antibody     Status: None   Collection Time: 01/11/15  6:30 AM  Result Value Ref Range   HIV Screen 4th Generation wRfx Non Reactive Non Reactive    Comment: (NOTE) Performed At: [facility] [place], [place] [id number] Lindon Romp MD ME:[id number] Performed at [facility]   Hepatitis C antibody     Status: None   Collection Time: 01/11/15  6:30 AM  Result Value Ref Range   HCV Ab NEGATIVE NEGATIVE    Comment: Performed at [facility]    Physical Findings: AIMS: Facial and Oral Movements Muscles of Facial Expression: None, normal Lips and Perioral Area: None, normal Jaw: None, normal Tongue: None, normal,Extremity Movements Upper (arms, wrists, hands, fingers): None, normal Lower (legs, knees, ankles, toes): None, normal, Trunk Movements Neck, shoulders,  hips: None, normal, Overall Severity Severity of abnormal movements (highest score from questions above): None, normal Incapacitation due to abnormal movements: None, normal Patient's awareness of abnormal movements (rate only patient's report): No Awareness, Dental Status Current problems with teeth and/or dentures?: No Does patient usually wear dentures?: No  CIWA:  CIWA-Ar Total: 2 COWS:  COWS Total Score: 6   Assessment- patient still presenting with some opiate withdrawal symptoms and describes craving for drug use, although reports high motivation in sobriety, and insight seems improved compared to admission. He is completing clonidine detox protocol. Interested in Naltrexone as an option to help him maintain sobriety, but at this time he is only  4 days S/P last opiate use , and still presenting with some WDL , so it is likely that Naltrexone would exacerbate WDL at this time.  Treatment Plan Summary: Daily contact with patient to assess and evaluate symptoms and progress in treatment, Medication management, Plan continue inpatient treatment  and continue medications as below  Continue Opiate Detox  with CLonidine Protocol Seroquel  To 25 mgrs   QHS PRN Insomnia or agitation Requip 0.25 mgrs QHS Will follow Consider discharge over weekend as he continues to stabilize- patient states he plans to return home, is interested in outpatient treatment options.    Medical Decision Making:  Established Problem, Stable/Improving (1), Review of Psycho-Social Stressors (1), Review or order clinical lab tests (1), Review of Last Therapy Session (1) and Review of Medication Regimen & Side Effects (2)     Iviona Hole 01/12/2015, 11:29 AM

## 2015-01-12 NOTE — [facility] Group Notes (Signed)
[facility] LCSW Group Therapy  01/12/2015 2:55 PM  Type of Therapy:  Group Therapy  Participation Level:  Active  Participation Quality:  Appropriate and Attentive  Affect:  Anxious  Cognitive:  Alert and Appropriate  Insight:  Limited  Engagement in Therapy:  Engaged  Modes of Intervention:  Discussion, Education and Support  Summary of Progress/Problems: Feelings around Relapse. Group members discussed the meaning of relapse and shared personal stories of relapse, how it affected them and others, and how they perceived themselves during this time. Group members were encouraged to identify triggers, warning signs and coping skills used when facing the possibility of relapse. Social supports were discussed and explored in detail. Nicholas [name] stated he was feeling very anxious today. He believes recovery is worth the effort because will be able to repair relationships. He states his mom enables his drugs use and sees that as a barrier for recovery. He is unsure how to discuss this with her without hurting her. He believes going to [employer] meetings and finding a sponsor will support him in his recovery.     Hyatt,Candace 01/12/2015, 2:55 PM

## 2015-01-12 NOTE — Progress Notes (Signed)
D)  Has been up and about this evening, attended karaoke and states he enjoyed the activity but didn't participate.  Has been pleasant and cooperative this evening, apologized for his behavior when he came in.  Talked about having his cell phone and feeling upset when it was taken and put in his locker.  Has been behaving appropriately with staff and peers this evening, compliant with meds and programming.  States feeling better but still has some w/d sx, some anxiety at times, runny nose.  Talked about going back to school when he gets himself back on track, had been studying pharmacy.  A)  Will continue to monitor for safety, continue POC R)  Safety maintained.

## 2015-01-13 NOTE — Progress Notes (Signed)
Pt alert and cooperative. Affect/mood anxious. "I feel giggly, I don't want to use". -SI/HI, Verbally contracts for safety. -A/[place]. Pt denies pain or physical discomfort. Visible in milieu, minimum interaction. Appears to have limited insight and no desire for further treatment. Emotional support and encouragement given. Will continue to monitor closely and evaluate for stabilization.

## 2015-01-13 NOTE — Progress Notes (Signed)
The patient attended last evening's A.A. Meeting and was appropriate. He was able to state that he uses drugs but did not offer anything else to share with the group.

## 2015-01-13 NOTE — Progress Notes (Signed)
Patient ID: Nicholas [name], male   DOB: 06/16/1991, 24 y.o.   MRN: [id number] Patient ID: Nicholas [name], male   DOB: [birth year], 24 y.o.   MRN: [id number] [facility] MD Progress Note  01/13/2015 5:53 PM Nicholas [name]  MRN:  [id number]  Subjective:   Nicholas [name] says he is all right, just tired. He continues the clonidine detox protocols. Denies any adverse effects. Participates in some groups sessions. Says he is motivated to staying drug free if it will help expunge his criminal record. He denies any SIHI, AVH. Denies any new issues. . Objective : I have discussed case with treatment team and have met with patient. Behavior on unit in control , increasing milieu participation. Although improved compared to admission, affect remains vaguely worried. He is ambivalent about abstinence, and states " I know I have to stop using", but he also , as above, describes cravings for opiates, and a desire to use hallucinogens again, as he states he has had prior pleasurable " trips" when using these substances in the past. We discussed the importance of considering abstinence from all addictive substances as treatment goal, and reviewed treatment options such as Rehab, which I have encouraged him to consider . At this time not interested, but does state he plans to go to NA or AA regularly. Night time restless legs improved and slept better. Tolerating Requip well.  Completing Clonidine taper.   Diagnosis:   Patient Active Problem List   Diagnosis Date Noted  . Suicide attempt by drug ingestion [T50.902A]   . Polysubstance dependence including opioid type drug without complication, episodic abuse [F11.220] 01/08/2015  . Polysubstance dependence including opioid type drug, continuous use [F11.229] 03/18/2014  . Substance induced mood disorder [F19.94] 03/18/2014  . Opiate dependence [F11.20] 03/17/2014  . Benzodiazepine dependence [F13.20] 03/17/2014   Total Time spent with patient: 25 minutes   Past Medical History:   Past Medical History  Diagnosis Date  . Migraine     Past Surgical History  Procedure Laterality Date  . Appendectomy     Family History: History reviewed. No pertinent family history. Social History:  History  Alcohol Use No     History  Drug Use  . Yes  . Special: Marijuana, Cocaine, Benzodiazepines, Oxycodone, Hydrocodone    Comment: opiates, pills    History   Social History  . Marital Status: Single    Spouse Name: N/A  . Number of Children: N/A  . Years of Education: N/A   Social History Main Topics  . Smoking status: Never Smoker   . Smokeless tobacco: Not on file  . Alcohol Use: No  . Drug Use: Yes    Special: Marijuana, Cocaine, Benzodiazepines, Oxycodone, Hydrocodone     Comment: opiates, pills  . Sexual Activity: Yes   Other Topics Concern  . None   Social History Narrative   Additional History:    Sleep: Fair  Appetite:  Good   Assessment:   Musculoskeletal: Strength & Muscle Tone: within normal limits- no significant tremors or diaphoresis noted thus far  Gait & Station: normal Patient leans: N/A   Psychiatric Specialty Exam: Physical Exam  ROS  Blood pressure 127/80, pulse 90, temperature 99 F (37.2 C), temperature source Oral, resp. rate 16, height 6' 1.5" (1.867 m), weight 103.874 kg (229 lb), SpO2 99 %.Body mass index is 29.8 kg/(m^2).  General Appearance: Well Groomed  Eye Contact::  Good  Speech:  Normal Rate  Volume:  Normal  Mood:  improved mood-  denies depression  Affect:  improved affect, but remains somewhat anxious  Thought Process:  Goal Directed and Linear  Orientation:  Full (Time, Place, and Person)  Thought Content:  denies hallucinations, no delusions, does not appear internally preoccupied   Suicidal Thoughts:  No at this time denies any thoughts of hurting self and  contracts for safety on unit   Homicidal Thoughts:  No  Memory: Recent and Remote grossly intact  Judgement:  Fair  Insight:  improving    Psychomotor Activity:  Normal  Concentration:  Good  Recall:  Good  Fund of Knowledge:Good  Language: Good  Akathisia:  Negative  Handed:  Right  AIMS (if indicated):     Assets:  Communication Skills Desire for Improvement Physical Health Resilience  ADL's:  improving  Cognition: WNL  Sleep:  Number of Hours: 6.75   Current Medications: Current Facility-Administered Medications  Medication Dose Route Frequency Provider Last Rate Last Dose  . acetaminophen (TYLENOL) tablet 650 mg  650 mg Oral Q6H PRN Laverle Hobby, PA-C   650 mg at 01/09/15 1815  . alum & mag hydroxide-simeth (MAALOX/MYLANTA) 200-200-20 MG/5ML suspension 30 mL  30 mL Oral Q4H PRN Laverle Hobby, PA-C      . cloNIDine (CATAPRES) tablet 0.1 mg  0.1 mg Oral BH-qamhs Laverle Hobby, PA-C   0.1 mg at 01/12/15 2155   Followed by  . [START ON 01/14/2015] cloNIDine (CATAPRES) tablet 0.1 mg  0.1 mg Oral QAC breakfast Laverle Hobby, PA-C      . dicyclomine (BENTYL) tablet 20 mg  20 mg Oral Q6H PRN Laverle Hobby, PA-C      . hydrOXYzine (ATARAX/VISTARIL) tablet 25 mg  25 mg Oral Q6H PRN Elmarie Shiley, NP   25 mg at 01/12/15 1803  . magnesium hydroxide (MILK OF MAGNESIA) suspension 30 mL  30 mL Oral Daily PRN Laverle Hobby, PA-C      . methocarbamol (ROBAXIN) tablet 500 mg  500 mg Oral Q8H PRN Laverle Hobby, PA-C   500 mg at 01/11/15 2249  . multivitamin with minerals tablet 1 tablet  1 tablet Oral Daily Jenne Campus, MD   1 tablet at 01/12/15 8588  . naproxen (NAPROSYN) tablet 500 mg  500 mg Oral BID PRN Laverle Hobby, PA-C   500 mg at 01/10/15 2232  . ondansetron (ZOFRAN-ODT) disintegrating tablet 4 mg  4 mg Oral Q8H PRN Elmarie Shiley, NP      . QUEtiapine (SEROQUEL) tablet 25 mg  25 mg Oral QHS PRN Jenne Campus, MD   25 mg at 01/12/15 2156  . rOPINIRole (REQUIP) tablet 0.25 mg  0.25 mg Oral QHS Myer Peer Cobos, MD   0.25 mg at 01/12/15 2154  . thiamine (VITAMIN B-1) tablet 100 mg  100 mg Oral Daily  Jenne Campus, MD   100 mg at 01/12/15 5027   Lab Results:  No results found for this or any previous visit (from the past 48 hour(s)).  Physical Findings: AIMS: Facial and Oral Movements Muscles of Facial Expression: None, normal Lips and Perioral Area: None, normal Jaw: None, normal Tongue: None, normal,Extremity Movements Upper (arms, wrists, hands, fingers): None, normal Lower (legs, knees, ankles, toes): None, normal, Trunk Movements Neck, shoulders, hips: None, normal, Overall Severity Severity of abnormal movements (highest score from questions above): None, normal Incapacitation due to abnormal movements: None, normal Patient's awareness of abnormal movements (rate only patient's report): No Awareness, Dental Status Current problems with teeth  and/or dentures?: No Does patient usually wear dentures?: No  CIWA:  CIWA-Ar Total: 0 COWS:  COWS Total Score: 0   Assessment- patient still presenting with some opiate withdrawal symptoms and describes craving for drug use, although reports high motivation in sobriety, and insight seems improved compared to admission. He is completing clonidine detox protocol. Interested in Naltrexone as an option to help him maintain sobriety, but at this time he is only 5 days S/P last opiate use , and still presenting with some withdrawal symptoms , so it is likely that Naltrexone would exacerbate WDL at this time.  Treatment Plan Summary: Daily contact with patient to assess and evaluate symptoms and progress in treatment, Medication management, Plan continue inpatient treatment  and continue medications as below   Continue Opiate Detox  with CLonidine Protocol Continue Seroquel 25 mgrs  QHS PRN Insomnia or agitation Requip 0.25 mgrs QHS Will follow Consider discharge over weekend as he continues to stabilize- patient states he plans to return home, is interested in outpatient treatment options.   Medical Decision Making:  Established Problem,  Stable/Improving (1), Review of Psycho-Social Stressors (1), Review or order clinical lab tests (1), Review of Last Therapy Session (1) and Review of Medication Regimen & Side Effects (2)  Encarnacion Slates, PMHNp-BC 01/13/2015, 5:53 PM  I agreed with the findings, treatment and disposition plan of this patient. Berniece Andreas, MD

## 2015-01-13 NOTE — Progress Notes (Signed)
Adult Psychoeducational Group Note  Date:  01/13/2015 Time:  5:01 PM  Group Topic/Focus:  Psychoeducational  Participation Level:  Did Not Attend  Participation Quality:  N/A  Affect:  N/A  Cognitive:  N/A  Insight: None  Engagement in Group:  N/A  Modes of Intervention:  N/A  Additional Comments:  N/A  Talbert NanSLOAN, Seleta Hovland N 01/13/2015, 5:01 PM

## 2015-01-13 NOTE — Progress Notes (Signed)
D)  Pt has been in the dayroom, playing and/or watching card games this evening, and watching tv.  Attended the AA group tonight.  Talked to him about marijuana use when he came to the med window.  He said he usually used it to help him with sleep, with anxiety, or whatever he found upsetting.  Stated he was coughing up almost black sputum.  Stated it had scared him at first, but decided he wouldn't give up smoking pot, but then he remembered he had gotten some oil on a shirt and it wouldn't come out, and we discussed how it does the same with fatty tissue.  Discussed how it was used medicinally for a lot of terminal illnesses.   A)  Encouraged pt to stop smoking pot, encouraged him to find other ways of dealing with stress or anxiety, eg going to the gym, working out, walking, etc.  Will continue to monitor for safety, continue POC R)  Receptive, safety maintained.

## 2015-01-13 NOTE — Progress Notes (Signed)
Patient ID: Nicholas [name], male   DOB: 07/25/1991, 24 y.o.   MRN: [id number]   D: Pt has been very flat and depressed on the unit today, patient was in the bed most of the day. Pt did not attend any groups nor did he engage in any treatment. Pt also refused all medications, he reported that he is not having any withdrawals. Pt reported being negative SI/HI, no AH/VH noted. A: 15 min checks continued for patient safety. R: Pt safety maintained.

## 2015-01-13 NOTE — [facility] Group Notes (Signed)
[facility] Group Notes: (Clinical Social Work)   01/13/2015      Type of Therapy:  Group Therapy   Participation Level:  Did Not Attend despite MHT prompting   Sederick Jacobsen Grossman-Orr, LCSW 01/13/2015, 12:21 PM     

## 2015-01-13 NOTE — [facility] Group Notes (Signed)
[facility] Group Notes:  Coping skills  Date:  01/13/2015  Time:  3:03 PM  Type of Therapy:  Nurse Education  Participation Level:  Active  Participation Quality:  Appropriate  Affect:  Appropriate  Cognitive:  Appropriate  Insight:  Appropriate  Engagement in Group:  Engaged  Modes of Intervention:  Discussion  Summary of Progress/Problems:  Nicholas [name], Nicholas [name] 01/13/2015, 3:03 PM

## 2015-01-14 DIAGNOSIS — F1920 Other psychoactive substance dependence, uncomplicated: Secondary | ICD-10-CM

## 2015-01-14 MED ORDER — HYDROXYZINE HCL 25 MG PO TABS: 25.0000 mg | ORAL_TABLET | Freq: Four times a day (QID) | ORAL | Status: DC | PRN

## 2015-01-14 MED ORDER — QUETIAPINE FUMARATE 25 MG PO TABS: 25.0000 mg | ORAL_TABLET | Freq: Every evening | ORAL | Status: DC | PRN

## 2015-01-14 MED ORDER — ROPINIROLE HCL 0.25 MG PO TABS: 0.2500 mg | ORAL_TABLET | Freq: Every day | ORAL | Status: DC

## 2015-01-14 NOTE — Discharge Summary (Signed)
Physician Discharge Summary Note  Patient:  Nicholas [name] is an 24 y.o., male MRN:  [id number] DOB:  08-Dec-1990 Patient phone:  [phone] (home)  Patient address:   [address] [place] [address],  Total Time spent with patient: Greater than 30 minutes  Date of Admission:  01/08/2015  Date of Discharge: 01/14/15  Reason for Admission: Opioid detox/mood stabilization  Principal Problem: Polysubstance dependence including opioid type drug without complication, episodic abuse Discharge Diagnoses: Patient Active Problem List   Diagnosis Date Noted  . Suicide attempt by drug ingestion [T50.902A]   . Polysubstance dependence including opioid type drug without complication, episodic abuse [F11.220] 01/08/2015  . Polysubstance dependence including opioid type drug, continuous use [F11.229] 03/18/2014  . Substance induced mood disorder [F19.94] 03/18/2014  . Opiate dependence [F11.20] 03/17/2014  . 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 Judgment and thought content normal. His mood appears not anxious. His affect is not angry, not blunt, not labile and not inappropriate. Cognition and memory are normal. He does not exhibit a depressed mood.    Review of Systems  Constitutional: Negative.   HENT: Negative.   Eyes: Negative.   Respiratory: Negative.   Cardiovascular: Negative.   Gastrointestinal: Negative.   Genitourinary: Negative.   Musculoskeletal: Negative.   Skin: Negative.   Neurological: Negative.   Endo/Heme/Allergies: Negative.   Psychiatric/Behavioral: Positive for hallucinations (Hx of) and substance abuse (Hx of Opioid addiction). Negative for depression and suicidal ideas. The patient has insomnia (Stable). The patient is not nervous/anxious.      Blood pressure 126/85, pulse 101, temperature 99.5 F (37.5 C), temperature source Oral, resp. rate 18, height 6' 1.5" (1.867 m), weight 103.874 kg (229 lb), SpO2 99 %.Body mass index is 29.8 kg/(m^2).  See Md's SRA   Past Medical History:  Past Medical History  Diagnosis Date  . Migraine     Past Surgical History  Procedure Laterality Date  . Appendectomy     Family History: History reviewed. No pertinent family history. Social History:  History  Alcohol Use No     History  Drug Use  . Yes  . Special: Marijuana, Cocaine, Benzodiazepines, Oxycodone, Hydrocodone    Comment: opiates, pills    History   Social History  . Marital Status: Single    Spouse Name: N/A  . Number of Children: N/A  . Years of Education: N/A   Social History Main Topics  . Smoking status: Never Smoker   . Smokeless tobacco: Not on file  . Alcohol Use: No  . Drug Use: Yes    Special: Marijuana, Cocaine, Benzodiazepines, Oxycodone, Hydrocodone     Comment: opiates, pills  . Sexual Activity: Yes   Other Topics Concern  . None   Social History Narrative   Risk to Self: Is patient at risk for suicide?: Yes Risk to Others: No Prior Inpatient Therapy: No Prior Outpatient Therapy: No Level of Care:  OP  Hospital Course:  Nicholas [name] is 24 years old Caucasian male. Admitted to [facility] from the [facility] Ed with complaints of suicidal thoughts. Although, given no specific plan, Patient was not able to contract for safety. Report indicated that he said if he was discharged from the ED, he will go home & harm himself. Nicholas [name] reports, "I was really doing well academically just last year,  then things got really bad for me. I got charged with stealing. Me & my folks were in a store, then, this woman out of no where came & accused me of stealing. It was a pack of Z-quill, Ginko & 5HT, sleep aid stuff. This happened back on December 23rd of 2015. The police were called, I was charged, but I pleaded not guilty.  Well, the hearing was yesterday, it did not go in my favor. It was because of this case that made me to not go back to school. I have this wrap sheet of being arrested. It makes me mad & infuriated. I have been very depressed since December of 2015. I stopped doing anything, self isolating behavior like being in my room all day watching TV. All I want now is; get me discharged and have my record expunged".  While a patient is this hospital, Nicholas [name] received both clonidine and Ativan detoxification treatment protocols for both Opioid & Benzodiazepine detoxification treatment. His UDS test reports upon admission was positive for Opioid, Benzodiazepine & THC, including elevated liver enzyme.  He was also enrolled in the group counseling sessions and activities to learn coping skills that should help him after discharge to cope better and manage his substance abuse issues to maintain sobriety.  He was also medicated & discharged on Hydroxyzine 25 mg for anxiety, Seroquel 25 mg Q hs for insomnia/mood control & Requip 0.25 mg q hs for restless leg syndrome. He presented no other significant or serious health issues that required other treatment and or monitoring. However, he was monitored closely for any potential problems that may arise as a result of his treatment. He tolerated his treatment regimen without any significant adverse effects and or reactions reported.   Nicholas [name] has successfully completed his detoxifcation treatments. He is currently being discharged to continue routine psychaitric care & medication management at the [facility] clinic in [place], [place].  He is provided with all the pertinent information required to make this appointment without problems. Upon discharge, he adamantly denies any suicidal, homicidal ideations,delusional thought, auditory, visual hallucinations and or withdrawal symptoms. He received 14 days worth supply samples of his [facility] discharge medications. He left [facility] with all belongings in  no distress.Transportation per family.   Consults:  psychiatry  Significant Diagnostic Studies:  labs: CBC with diff, CMP, UDS, toxicology tests, U/A, results reviewed, no changes  Discharge Vitals:   Blood pressure 126/85, pulse 101, temperature 99.5 F (37.5 C), temperature source Oral, resp. rate 18, height 6' 1.5" (1.867 m), weight 103.874 kg (229 lb), SpO2 99 %. Body mass index is 29.8 kg/(m^2). Lab Results:   No results found for this or any previous visit (from the past 72 hour(s)).  Physical Findings: AIMS: Facial and Oral Movements Muscles of Facial Expression: None, normal Lips and Perioral Area: None, normal Jaw: None, normal Tongue: None, normal,Extremity Movements Upper (arms, wrists, hands, fingers): None, normal Lower (legs, knees, ankles, toes): None, normal, Trunk Movements Neck, shoulders, hips: None, normal, Overall Severity Severity of abnormal movements (highest score from questions above): None, normal Incapacitation due to abnormal movements: None, normal Patient's awareness of abnormal movements (rate only patient's report): No Awareness, Dental Status Current problems with teeth and/or dentures?: No Does patient usually wear dentures?: No  CIWA:  CIWA-Ar Total: 1 COWS:  COWS Total Score: 1   See Psychiatric Specialty Exam and Suicide Risk Assessment completed by Attending Physician prior to discharge.  Discharge destination:  Home  Is patient on multiple antipsychotic therapies  at discharge:  No   Has Patient had three or more failed trials of antipsychotic monotherapy by history:  No  Recommended Plan for Multiple Antipsychotic Therapies: NA    Medication List    STOP taking these medications        carbamazepine 200 MG 12 hr tablet  Commonly known as:  TEGRETOL XR     doxepin 25 MG capsule  Commonly known as:  SINEQUAN      TAKE these medications      Indication   hydrOXYzine 25 MG tablet  Commonly known as:  ATARAX/VISTARIL  Take 1  tablet (25 mg total) by mouth every 6 (six) hours as needed for anxiety.   Indication:  Anxiety Neurosis, Sedation, Tension     QUEtiapine 25 MG tablet  Commonly known as:  SEROQUEL  Take 1 tablet (25 mg total) by mouth at bedtime as needed (Insomnia).   Indication:  Insomnia     rOPINIRole 0.25 MG tablet  Commonly known as:  REQUIP  Take 1 tablet (0.25 mg total) by mouth at bedtime. For restless leg syndrome   Indication:  Restless Leg Syndrome       Follow-up Information    [facility] with [facility] [place] On 01/16/2015.   Why:  Appt for hospital follow-up on this date. Please walk in between 8:30AM-10:30AM. PLEASE BE SURE TO REQUEST 1:1 THERAPY IF INTERESTED. Referral number: [id number]   Contact information:   [address] [place], [place] [address] Phone: [phone] Fax: [phone]     Follow-up recommendations:  Activity:  As tolerated Diet: As recommended by your primary care doctor. Keep all scheduled follow-up appointments as recommended.  Comments: Take all your medications as prescribed by your mental healthcare provider. Report any adverse effects and or reactions from your medicines to your outpatient provider promptly. Patient is instructed and cautioned to not engage in alcohol and or illegal drug use while on prescription medicines. In the event of worsening symptoms, patient is instructed to call the crisis hotline, 911 and or go to the nearest ED for appropriate evaluation and treatment of symptoms. Follow-up with your primary care provider for your other medical issues, concerns and or health care needs.   Total Discharge Time: Greater than 30 minutes  Signed: Sanjuana Kavawoko, Agnes I, PMHNP-BC 01/14/2015, 4:16 PM  I have personally seen the patient and agreed with the findings and involved in the treatment plan. Kathryne SharperSyed Waco [place], MD

## 2015-01-14 NOTE — [facility] Group Notes (Signed)
[facility] Group Notes:  (Nursing/MHT/Case Management/Adjunct)  Date:  01/14/2015  Time:  3:42 PM  Type of Therapy:  Psychoeducational Skills  Participation Level:  Did Not Attend  Participation Quality:  Did Not Attend  Affect:  Did Not Attend  Cognitive:  Did Not Attend  Insight:  None  Engagement in Group:  Did Not Attend  Modes of Intervention:  Did Not Attend  Summary of Progress/Problems: Pt did not attend healthy support systems group.   Jacquelyne BalintForrest, Brailey Buescher Shanta 01/14/2015, 3:42 PM

## 2015-01-14 NOTE — [facility] Group Notes (Signed)
[facility] Group Notes:  (Nursing/MHT/Case Management/Adjunct)  Date:  01/14/2015  Time:  3:42 PM  Type of Therapy:  Psychoeducational Skills  Participation Level:  Did Not Attend  Participation Quality:  Did Not Attend  Affect:  Did Not Attend  Cognitive:  Did Not Attend  Insight:  None  Engagement in Group:  Did Not Attend  Modes of Intervention:  Did Not Attend  Summary of Progress/Problems: Pt did not attend patient self inventory group.   Jacquelyne BalintForrest, Darnice Comrie Shanta 01/14/2015, 3:42 PM

## 2015-01-14 NOTE — [facility] Group Notes (Signed)
[facility] Group Notes: (Clinical Social Work)   01/14/2015      Type of Therapy:  Group Therapy   Participation Level:  Did Not Attend despite MHT prompting   Nicholas Mole Grossman-Orr, LCSW 01/14/2015, 12:06 PM     

## 2015-01-14 NOTE — [facility] Suicide Risk Assessment (Addendum)
[facility]   Demographic Factors:  Male, Adolescent or young adult and Unemployed  Total Time spent with patient: 30 minutes  Musculoskeletal: Strength & Muscle Tone: within normal limits Gait & Station: normal Patient leans: N/A  Psychiatric Specialty Exam: Physical Exam  ROS  Blood pressure 126/85, pulse 101, temperature 99.5 F (37.5 C), temperature source Oral, resp. rate 18, height 6' 1.5" (1.867 m), weight 103.874 kg (229 lb), SpO2 99 %.Body mass index is 29.8 kg/(m^2).  General Appearance: Casual  Eye Contact::  Good  Speech:  Slow409  Volume:  Decreased  Mood:  Anxious  Affect:  Congruent  Thought Process:  Linear and Logical  Orientation:  Full (Time, Place, and Person)  Thought Content:  Rumination  Suicidal Thoughts:  No  Homicidal Thoughts:  No  Memory:  Immediate;   Fair Recent;   Fair Remote;   Fair  Judgement:  Intact  Insight:  Good  Psychomotor Activity:  Normal  Concentration:  Fair  Recall:  Good  Fund of Knowledge:Good  Language: Good  Akathisia:  No  Handed:  Right  AIMS (if indicated):     Assets:  Communication Skills Desire for Improvement Housing Physical Health Resilience Social Support Talents/Skills Transportation Vocational/Educational  Sleep:  Number of Hours: 6.5  Cognition: WNL  ADL's:  Impaired   Have you used any form of tobacco in the last 30 days? (Cigarettes, Smokeless Tobacco, Cigars, and/or Pipes): No  Has this patient used any form of tobacco in the last 30 days? (Cigarettes, Smokeless Tobacco, Cigars, and/or Pipes) No  Mental Status Per Nursing Assessment::   On Admission:  Suicidal ideation indicated by patient, Self-harm thoughts  Current Mental Status by Physician: Denies suicidal thoughts.  See mental status examination above  Loss Factors: Legal issues and Financial problems/change in socioeconomic status  Historical Factors: Impulsivity  Risk Reduction Factors:   Sense of  responsibility to family, Religious beliefs about death, Living with another person, especially a relative, Positive social support, Positive therapeutic relationship and Positive coping skills or problem solving skills  Continued Clinical Symptoms:  Alcohol/Substance Abuse/Dependencies  Cognitive Features That Contribute To Risk:  None    Suicide Risk:  Minimal: No identifiable suicidal ideation.  Patients presenting with no risk factors but with morbid ruminations; may be classified as minimal risk based on the severity of the depressive symptoms  Principal Problem: Polysubstance dependence including opioid type drug without complication, episodic abuse Discharge Diagnoses:  Patient Active Problem List   Diagnosis Date Noted  . Suicide attempt by drug ingestion [T50.902A]   . Polysubstance dependence including opioid type drug without complication, episodic abuse [F11.220] 01/08/2015  . Polysubstance dependence including opioid type drug, continuous use [F11.229] 03/18/2014  . Substance induced mood disorder [F19.94] 03/18/2014  . Opiate dependence [F11.20] 03/17/2014  . 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   Why:  Appt for hospital follow-up on this date. Please walk in between 8:30AM-10:30AM. PLEASE BE SURE TO REQUEST 1:1 THERAPY IF INTERESTED. Referral number: [id number]   Contact information:   [address] [place], [place] [address] Phone: [phone] Fax: [phone]      Plan Of Care/Follow-up recommendations:  Activity:  As tolerated Diet:  Unchanged from past  Is patient on multiple antipsychotic therapies at discharge:  No   Has Patient had three or more failed trials of antipsychotic monotherapy by history:  No  Recommended Plan for Multiple Antipsychotic  Therapies: NA    Nicholas Staver T. 01/14/2015, 10:11 AM

## 2015-01-14 NOTE — Progress Notes (Signed)
Patient ID: Nicholas [name], male   DOB: 05/04/1991, 24 y.o.   MRN: [id number]   Pt was discharged home with his mother, pt reported no issues or concerns at time of discharge. Pt was given his discharge instructions, and his sample medications. Pt reported that he was ready for discharge, he reported being negative SI/HI no AH/VH noted.

## 2015-01-16 NOTE — Progress Notes (Signed)
Patient Discharge Instructions:  After Visit Summary (AVS):   Faxed to:  01/16/15 Discharge Summary Note:   Faxed to:  01/16/15 Psychiatric Admission Assessment Note:   Faxed to:  01/16/15 Suicide Risk Assessment - Discharge Assessment:   Faxed to:  01/16/15 Faxed/Sent to the Next Level Care provider:  01/16/15  [facility] [phone]  Jerelene ReddenSheena E [place], 01/16/2015, 1:35 PM

## 2015-05-18 ENCOUNTER — Encounter (HOSPITAL_COMMUNITY): Payer: Self-pay | Admitting: Emergency Medicine

## 2015-05-18 ENCOUNTER — Emergency Department (HOSPITAL_COMMUNITY): Payer: Self-pay

## 2015-05-18 ENCOUNTER — Emergency Department (HOSPITAL_COMMUNITY)
Admission: EM | Admit: 2015-05-18 | Discharge: 2015-05-20 | Disposition: A | Discharge status: Home / Self Care | Payer: Self-pay | Attending: Emergency Medicine | Admitting: Emergency Medicine

## 2015-05-18 DIAGNOSIS — F1910 Other psychoactive substance abuse, uncomplicated: Secondary | ICD-10-CM

## 2015-05-18 DIAGNOSIS — F1310 Sedative, hypnotic or anxiolytic abuse, uncomplicated: Secondary | ICD-10-CM | POA: Insufficient documentation

## 2015-05-18 DIAGNOSIS — R45851 Suicidal ideations: Secondary | ICD-10-CM

## 2015-05-18 DIAGNOSIS — F419 Anxiety disorder, unspecified: Secondary | ICD-10-CM | POA: Insufficient documentation

## 2015-05-18 DIAGNOSIS — R0789 Other chest pain: Secondary | ICD-10-CM | POA: Insufficient documentation

## 2015-05-18 DIAGNOSIS — Z8679 Personal history of other diseases of the circulatory system: Secondary | ICD-10-CM | POA: Insufficient documentation

## 2015-05-18 DIAGNOSIS — F1110 Opioid abuse, uncomplicated: Secondary | ICD-10-CM | POA: Insufficient documentation

## 2015-05-18 DIAGNOSIS — F1210 Cannabis abuse, uncomplicated: Secondary | ICD-10-CM | POA: Insufficient documentation

## 2015-05-18 LAB — CBC WITH DIFFERENTIAL/PLATELET
BASOS ABS: 0.1 10*3/uL (ref 0.0–0.1)
Basophils Relative: 1 % (ref 0–1)
EOS ABS: 0.2 10*3/uL (ref 0.0–0.7)
Eosinophils Relative: 1 % (ref 0–5)
HCT: 42.4 % (ref 39.0–52.0)
HEMOGLOBIN: 14.1 g/dL (ref 13.0–17.0)
Lymphocytes Relative: 12 % (ref 12–46)
Lymphs Abs: 2.2 10*3/uL (ref 0.7–4.0)
MCH: 31.7 pg (ref 26.0–34.0)
MCHC: 33.3 g/dL (ref 30.0–36.0)
MCV: 95.3 fL (ref 78.0–100.0)
Monocytes Absolute: 0.8 10*3/uL (ref 0.1–1.0)
Monocytes Relative: 4 % (ref 3–12)
NEUTROS ABS: 14.5 10*3/uL — AB (ref 1.7–7.7)
Neutrophils Relative %: 82 % — ABNORMAL HIGH (ref 43–77)
Platelets: 246 10*3/uL (ref 150–400)
RBC: 4.45 MIL/uL (ref 4.22–5.81)
RDW: 13 % (ref 11.5–15.5)
WBC: 17.9 10*3/uL — ABNORMAL HIGH (ref 4.0–10.5)

## 2015-05-18 LAB — URINALYSIS, ROUTINE W REFLEX MICROSCOPIC
BILIRUBIN URINE: NEGATIVE
Glucose, UA: NEGATIVE mg/dL
Hgb urine dipstick: NEGATIVE
Ketones, ur: NEGATIVE mg/dL
Leukocytes, UA: NEGATIVE
Nitrite: NEGATIVE
Protein, ur: NEGATIVE mg/dL
Specific Gravity, Urine: 1.02 (ref 1.005–1.030)
Urobilinogen, UA: 0.2 mg/dL (ref 0.0–1.0)
pH: 6.5 (ref 5.0–8.0)

## 2015-05-18 LAB — BASIC METABOLIC PANEL
ANION GAP: 10 (ref 5–15)
BUN: 11 mg/dL (ref 6–20)
CO2: 28 mmol/L (ref 22–32)
Calcium: 9.6 mg/dL (ref 8.9–10.3)
Chloride: 101 mmol/L (ref 101–111)
Creatinine, Ser: 0.88 mg/dL (ref 0.61–1.24)
GFR calc non Af Amer: >60 mL/min (ref >60)
Glucose, Bld: 111 mg/dL — ABNORMAL HIGH (ref 65–99)
Potassium: 3.8 mmol/L (ref 3.5–5.1)
SODIUM: 139 mmol/L (ref 135–145)

## 2015-05-18 LAB — TROPONIN I: Troponin I: <0.03 ng/mL (ref <0.031)

## 2015-05-18 LAB — RAPID URINE DRUG SCREEN, HOSP PERFORMED
AMPHETAMINES: NOT DETECTED
BARBITURATES: NOT DETECTED
BENZODIAZEPINES: POSITIVE — AB
COCAINE: NOT DETECTED
Opiates: POSITIVE — AB
Tetrahydrocannabinol: POSITIVE — AB

## 2015-05-18 LAB — HEPATIC FUNCTION PANEL
ALBUMIN: 5 g/dL (ref 3.5–5.0)
ALK PHOS: 107 U/L (ref 38–126)
ALT: 54 U/L (ref 17–63)
AST: 44 U/L — AB (ref 15–41)
BILIRUBIN DIRECT: 0.1 mg/dL (ref 0.1–0.5)
Indirect Bilirubin: 0.8 mg/dL (ref 0.3–0.9)
Total Bilirubin: 0.9 mg/dL (ref 0.3–1.2)
Total Protein: 8.3 g/dL — ABNORMAL HIGH (ref 6.5–8.1)

## 2015-05-18 LAB — ACETAMINOPHEN LEVEL: Acetaminophen (Tylenol), Serum: <10 ug/mL — ABNORMAL LOW (ref 10–30)

## 2015-05-18 LAB — SALICYLATE LEVEL: Salicylate Lvl: <4 mg/dL (ref 2.8–30.0)

## 2015-05-18 LAB — ETHANOL: Alcohol, Ethyl (B): <5 mg/dL (ref <5)

## 2015-05-18 MED ORDER — LORAZEPAM 1 MG PO TABS
0.0000 mg | ORAL_TABLET | Freq: Four times a day (QID) | ORAL | Status: AC
Start: 2015-05-18 — End: 2015-05-20
  Administered 2015-05-18: 1 mg via ORAL
  Administered 2015-05-18: 2 mg via ORAL
  Administered 2015-05-19: 1 mg via ORAL
  Filled 2015-05-18 (×3): qty 1
  Filled 2015-05-18: qty 2

## 2015-05-18 MED ORDER — LORAZEPAM 2 MG/ML IJ SOLN: 1.0000 mg | Freq: Four times a day (QID) | INTRAMUSCULAR | Status: DC | PRN

## 2015-05-18 MED ORDER — LORAZEPAM 1 MG PO TABS: 0.0000 mg | ORAL_TABLET | Freq: Two times a day (BID) | ORAL | Status: DC

## 2015-05-18 MED ORDER — LOPERAMIDE HCL 2 MG PO CAPS: 2.0000 mg | ORAL_CAPSULE | ORAL | Status: DC | PRN

## 2015-05-18 MED ORDER — LORAZEPAM 1 MG PO TABS
1.0000 mg | ORAL_TABLET | Freq: Once | ORAL | Status: AC
  Administered 2015-05-18: 1 mg via ORAL
  Filled 2015-05-18: qty 1

## 2015-05-18 MED ORDER — ONDANSETRON 4 MG PO TBDP
4.0000 mg | ORAL_TABLET | Freq: Four times a day (QID) | ORAL | Status: DC | PRN
  Administered 2015-05-19: 4 mg via ORAL
  Filled 2015-05-18: qty 1

## 2015-05-18 MED ORDER — THIAMINE HCL 100 MG/ML IJ SOLN: 100.0000 mg | Freq: Every day | INTRAMUSCULAR | Status: DC

## 2015-05-18 MED ORDER — CLONIDINE HCL 0.1 MG PO TABS: 0.1000 mg | ORAL_TABLET | Freq: Every day | ORAL | Status: DC

## 2015-05-18 MED ORDER — CLONIDINE HCL 0.1 MG PO TABS
0.1000 mg | ORAL_TABLET | Freq: Four times a day (QID) | ORAL | Status: DC
  Administered 2015-05-18 – 2015-05-20 (×8): 0.1 mg via ORAL
  Filled 2015-05-18 (×7): qty 1

## 2015-05-18 MED ORDER — ACETAMINOPHEN 325 MG PO TABS: 650.0000 mg | ORAL_TABLET | ORAL | Status: DC | PRN

## 2015-05-18 MED ORDER — METHOCARBAMOL 500 MG PO TABS
500.0000 mg | ORAL_TABLET | Freq: Three times a day (TID) | ORAL | Status: DC | PRN
  Administered 2015-05-18 – 2015-05-19 (×3): 500 mg via ORAL
  Filled 2015-05-18 (×3): qty 1

## 2015-05-18 MED ORDER — LORAZEPAM 1 MG PO TABS
1.0000 mg | ORAL_TABLET | Freq: Four times a day (QID) | ORAL | Status: DC | PRN
  Administered 2015-05-18 – 2015-05-20 (×5): 1 mg via ORAL
  Filled 2015-05-18 (×3): qty 1

## 2015-05-18 MED ORDER — HYDROXYZINE HCL 25 MG PO TABS
25.0000 mg | ORAL_TABLET | Freq: Four times a day (QID) | ORAL | Status: DC | PRN
  Administered 2015-05-18 – 2015-05-20 (×2): 25 mg via ORAL
  Filled 2015-05-18 (×2): qty 1

## 2015-05-18 MED ORDER — ADULT MULTIVITAMIN W/MINERALS CH
1.0000 | ORAL_TABLET | Freq: Every day | ORAL | Status: DC
  Administered 2015-05-18 – 2015-05-20 (×3): 1 via ORAL
  Filled 2015-05-18 (×2): qty 1

## 2015-05-18 MED ORDER — FOLIC ACID 1 MG PO TABS
1.0000 mg | ORAL_TABLET | Freq: Every day | ORAL | Status: DC
  Administered 2015-05-18 – 2015-05-20 (×3): 1 mg via ORAL
  Filled 2015-05-18 (×2): qty 1

## 2015-05-18 MED ORDER — ONDANSETRON HCL 4 MG PO TABS
4.0000 mg | ORAL_TABLET | Freq: Three times a day (TID) | ORAL | Status: DC | PRN
  Administered 2015-05-20: 4 mg via ORAL
  Filled 2015-05-18: qty 1

## 2015-05-18 MED ORDER — DICYCLOMINE HCL 10 MG PO CAPS: 20.0000 mg | ORAL_CAPSULE | Freq: Four times a day (QID) | ORAL | Status: DC | PRN

## 2015-05-18 MED ORDER — VITAMIN B-1 100 MG PO TABS
100.0000 mg | ORAL_TABLET | Freq: Every day | ORAL | Status: DC
  Administered 2015-05-18 – 2015-05-20 (×3): 100 mg via ORAL
  Filled 2015-05-18 (×2): qty 1

## 2015-05-18 MED ORDER — NICOTINE 21 MG/24HR TD PT24: 21.0000 mg | MEDICATED_PATCH | Freq: Every day | TRANSDERMAL | Status: DC

## 2015-05-18 MED ORDER — NAPROXEN 250 MG PO TABS
500.0000 mg | ORAL_TABLET | Freq: Two times a day (BID) | ORAL | Status: DC | PRN
  Administered 2015-05-19: 500 mg via ORAL
  Filled 2015-05-18: qty 2

## 2015-05-18 MED ORDER — CLONIDINE HCL 0.1 MG PO TABS: 0.1000 mg | ORAL_TABLET | ORAL | Status: DC

## 2015-05-18 NOTE — ED Notes (Signed)
Patient stating that this process is too long and he wants to go home. Patient informed that since he has admitted HI we will not be able to let him go and we would have to take out IVC papers if he tries to leave. Patient agreeable to staying at this time.

## 2015-05-18 NOTE — ED Notes (Signed)
Patient c/o leg cramps. 

## 2015-05-18 NOTE — ED Notes (Signed)
Patient being manipulative and Stating he can get out of here if he wants and requesting ativan instead of hydroxyzine. EDP aware.

## 2015-05-18 NOTE — ED Notes (Signed)
Patient continues to want to leave.  

## 2015-05-18 NOTE — ED Notes (Signed)
Patient stating that he thinks he is "having seizures and convulsing". Patient alert and in nad. Patient advised that he has had all medications that are ordered at this time and EDP has been made aware of patient's request to have more ativan.
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
## 2015-05-18 NOTE — ED Provider Notes (Signed)
CSN: [id number]     Arrival date & time 05/18/15  1247 History   First MD Initiated Contact with Patient 05/18/15 1326     Chief Complaint  Patient presents with  . V70.1     (Consider location/radiation/quality/duration/timing/severity/associated sxs/prior Treatment) HPI Comments: Patient states he attempted to seek drug detox in [place] and was told his "drug levels were too high" and needed to come to the emergency department. Patient abuses opiates, Xanax and marijuana. Admits to homicidal thoughts towards drug dealers without a plan. States he uses up to 100 mg of OxyContin a day as well as 10 mg of Xanax. Denies any injection drug abuse. Denies any alcohol use. Last use of Xanax was this morning. Last use of opiates was yesterday. He endorses a three-day history of intermittent left-sided chest pain that comes and goes lasting a few minutes at a time. It is worse with palpation. No shortness of breath, nausea or diaphoresis. Denies any cocaine use. No chest pain currently. No abdominal pain, nausea or vomiting. No headache, focal weakness, numbness or tingling. Denies any suicidal thoughts but told the nurse he would "rob a pharmacy and let the cops take me out"  The history is provided by the patient.    Past Medical History  Diagnosis Date  . Migraine    Past Surgical History  Procedure Laterality Date  . Appendectomy     History reviewed. No pertinent family history. History  Substance Use Topics  . Smoking status: Never Smoker   . Smokeless tobacco: Not on file  . Alcohol Use: No    Review of Systems  Constitutional: Negative for fever, activity change and appetite change.  HENT: Negative for congestion and rhinorrhea.   Eyes: Negative for visual disturbance.  Respiratory: Positive for chest tightness. Negative for cough and shortness of breath.   Cardiovascular: Positive for chest pain.  Gastrointestinal: Negative for nausea, vomiting and abdominal pain.   Genitourinary: Negative for dysuria, hematuria and testicular pain.  Musculoskeletal: Negative for myalgias and arthralgias.  Skin: Negative for wound.  Neurological: Negative for dizziness, weakness, light-headedness and headaches.  Psychiatric/Behavioral: Positive for suicidal ideas, confusion and self-injury. The patient is nervous/anxious.   A complete 10 system review of systems was obtained and all systems are negative except as noted in the HPI and PMH.      Allergies  Review of patient's allergies indicates no known allergies.  Home Medications   Prior to Admission medications   Medication Sig Start Date End Date Taking? Authorizing Provider  QUEtiapine (SEROQUEL) 25 MG tablet Take 1 tablet (25 mg total) by mouth at bedtime as needed (Insomnia). 01/14/15  Yes Sanjuana Kava, NP  hydrOXYzine (ATARAX/VISTARIL) 25 MG tablet Take 1 tablet (25 mg total) by mouth every 6 (six) hours as needed for anxiety. Patient not taking: Reported on 05/18/2015 01/14/15   Sanjuana Kava, NP  rOPINIRole (REQUIP) 0.25 MG tablet Take 1 tablet (0.25 mg total) by mouth at bedtime. For restless leg syndrome Patient not taking: Reported on 05/18/2015 01/14/15   Sanjuana Kava, NP   BP 135/80 mmHg  Pulse 99  Temp(Src) 98.5 F (36.9 C) (Oral)  Resp 18  Ht  (1.905 m)  Wt 240 lb (108.863 kg)  BMI 30.00 kg/m2  SpO2 100% Physical Exam  Constitutional: He is oriented to person, place, and time. He appears well-developed and well-nourished. No distress.  Flat affect  HENT:  Head: Normocephalic and atraumatic.  Mouth/Throat: Oropharynx is clear and moist.  No oropharyngeal exudate.  Eyes: Conjunctivae and EOM are normal. Pupils are equal, round, and reactive to light.  Neck: Normal range of motion. Neck supple.  No meningismus.  Cardiovascular: Normal rate, regular rhythm, normal heart sounds and intact distal pulses.   No murmur heard. Pulmonary/Chest: Effort normal and breath sounds normal. No  respiratory distress. He exhibits tenderness.  L chest wall tenderness  Abdominal: Soft. There is no tenderness. There is no rebound and no guarding.  Musculoskeletal: Normal range of motion. He exhibits no edema or tenderness.  Neurological: He is alert and oriented to person, place, and time. No cranial nerve deficit. He exhibits normal muscle tone. Coordination normal.  No ataxia on finger to nose bilaterally. No pronator drift. 5/5 strength throughout. CN 2-12 intact. Negative Romberg. Equal grip strength. Sensation intact. Gait is normal.   Skin: Skin is warm.  Psychiatric: He has a normal mood and affect. His behavior is normal.  Nursing note and vitals reviewed.   ED Course  Procedures (including critical care time) Labs Review Labs Reviewed  CBC WITH DIFFERENTIAL/PLATELET - Abnormal; Notable for the following:    WBC 17.9 (*)    Neutrophils Relative % 82 (*)    Neutro Abs 14.5 (*)    All other components within normal limits  BASIC METABOLIC PANEL - Abnormal; Notable for the following:    Glucose, Bld 111 (*)    All other components within normal limits  URINE RAPID DRUG SCREEN, HOSP PERFORMED - Abnormal; Notable for the following:    Opiates POSITIVE (*)    Benzodiazepines POSITIVE (*)    Tetrahydrocannabinol POSITIVE (*)    All other components within normal limits  ACETAMINOPHEN LEVEL - Abnormal; Notable for the following:    Acetaminophen (Tylenol), Serum <10 (*)    All other components within normal limits  HEPATIC FUNCTION PANEL - Abnormal; Notable for the following:    Total Protein 8.3 (*)    AST 44 (*)    All other components within normal limits  URINALYSIS, ROUTINE W REFLEX MICROSCOPIC (NOT AT [facility])  ETHANOL  SALICYLATE LEVEL  TROPONIN I    Imaging Review Dg Chest 2 View  05/18/2015   CLINICAL DATA:  Chest pain for 3 months.  Chronic marrow 1 he use.  EXAM: CHEST  2 VIEW  COMPARISON:  None.  FINDINGS: Cardiopericardial silhouette within normal limits.  Mediastinal contours normal. Trachea midline. No airspace disease or effusion.  IMPRESSION: No active cardiopulmonary disease.   Electronically Signed   By: Andreas Newport M.D.   On: 05/18/2015 15:08     EKG Interpretation   Date/Time:  Friday May 18 2015 14:03:53 EDT Ventricular Rate:  77 PR Interval:  132 QRS Duration: 86 QT Interval:  342 QTC Calculation: 387 R Axis:   44 Text Interpretation:  Normal sinus rhythm with sinus arrhythmia Normal ECG  No previous ECGs available Confirmed by Manus Gunning  MD, Dnya Hickle [phone]) on  05/18/2015 2:15:33 PM      MDM   Final diagnoses:  None   Drug abuse with vague homicidal and suicidal thoughts. Vital stable. Intermittent chest pain for the past 3 days that is reproducible. Atypical for ACS. Check EKG and labs.  Labs show mild leukocytosis. Chest x-ray negative. EKG normal sinus rhythm. Other screening labs within normal limits.  Chest pain is infrequent and atypical for ACS. Patient demanding Suboxone stating he wants to leave. Patient placed under IVC because he expresses homicidal as well as suicidal ideation. [facility] detox protocol ordered  for symptom control.  Patient meets inpatient criteria for placement.  Holding orders placed. Patient was given protocol orders for opiate withdrawal as well as benzodiazepine withdrawal. He takes no medications prescribed chronically. He is requesting medication for sleep but refuses Ambien or trazodone.  He is medically clear for psychiatric placement.  Glynn OctaveStephen Tsugio Elison, MD 05/18/15 2216

## 2015-05-18 NOTE — ED Notes (Addendum)
Patient states "I was told to come here because I have a high amount of drugs in my system and I need to be medically cleared so that I can go to detox." Patient states he took xanax and smoke marijuana today. Patient admits to HI. During triage patient states "I wouldn't hurt myself, but I would rob a pharmacy and let the cops take me out."

## 2015-05-18 NOTE — Progress Notes (Addendum)
Patient was referred for IP treatment at: [facility] - per intake, fax it. [facility] - per [place], has beds but won't take anyone this weekend. [facility] - per Thayer Ohmhris, fax referral. [facility] - per [facility]aciella Beltonianne, fax referral. [facility] - per [facility], fax it. [facility] - per intake, adult and adolescent beds open. [facility] - per intake, fax referral. [facility] - left voicemail. [facility] - per intake, fax referral for review. [facility] - left voicemail. [facility] - per [place], fax it.  [facility]  [facility] - per Phill, 1 bed and holding it for "our ED".  CSW will continue to seek placement.  Melbourne Abtsatia Mahogani Holohan, LCSWA Disposition staff 05/18/2015 9:48 PM

## 2015-05-18 NOTE — ED Notes (Signed)
Pt constantly coming out of room, pushing alert button, pt wanting to go outside, when instructed he can't, he responded with "you're a dick".

## 2015-05-18 NOTE — ED Notes (Signed)
Mom notified of patient being placed in psych hold and hours of visitation. Mom leaving at this time.

## 2015-05-18 NOTE — ED Notes (Signed)
Patient has been made IVC.

## 2015-05-18 NOTE — ED Notes (Signed)
Patient continues to report anxiety and is very agitated. Will given prn ativan.

## 2015-05-19 MED ORDER — ZIPRASIDONE MESYLATE 20 MG IM SOLR
10.0000 mg | Freq: Once | INTRAMUSCULAR | Status: AC
  Administered 2015-05-19: 10 mg via INTRAMUSCULAR

## 2015-05-19 MED ORDER — LORAZEPAM 2 MG/ML IJ SOLN
INTRAMUSCULAR | Status: AC
  Filled 2015-05-19: qty 1

## 2015-05-19 MED ORDER — LORAZEPAM 2 MG/ML IJ SOLN
2.0000 mg | Freq: Once | INTRAMUSCULAR | Status: AC
  Administered 2015-05-19: 2 mg via INTRAMUSCULAR

## 2015-05-19 MED ORDER — ZIPRASIDONE MESYLATE 20 MG IM SOLR
INTRAMUSCULAR | Status: AC
  Administered 2015-05-19: 10 mg via INTRAMUSCULAR
  Filled 2015-05-19: qty 20

## 2015-05-19 MED ORDER — QUETIAPINE FUMARATE 25 MG PO TABS
25.0000 mg | ORAL_TABLET | Freq: Every evening | ORAL | Status: DC | PRN
  Administered 2015-05-19 (×2): 25 mg via ORAL
  Filled 2015-05-19 (×4): qty 1

## 2015-05-19 MED ORDER — STERILE WATER FOR INJECTION IJ SOLN
INTRAMUSCULAR | Status: AC
  Administered 2015-05-19: 1.2 mL
  Filled 2015-05-19: qty 10

## 2015-05-19 MED ORDER — QUETIAPINE FUMARATE 25 MG PO TABS
ORAL_TABLET | ORAL | Status: AC
  Filled 2015-05-19: qty 1

## 2015-05-19 MED ORDER — ZIPRASIDONE MESYLATE 20 MG IM SOLR
INTRAMUSCULAR | Status: AC
  Filled 2015-05-19: qty 20

## 2015-05-19 NOTE — ED Notes (Signed)
Geodon given per order. EDP notifed of patient verbal response to medication. He continues to be verbally manipulative and attempting to pull against police cuffs on bed.

## 2015-05-19 NOTE — ED Notes (Signed)
Pt has repeatedly been to nursing station asking for more medication. Pt has been in up and down and asking for specific drugs. 5 mg of oxy codone, serequel,  of vistoril, and tranqilizers. Pt has also again asked if he "acted out" if he would get a tranquilizer.

## 2015-05-19 NOTE — ED Notes (Signed)
Per [facility]-pt still under review at several facilities.

## 2015-05-19 NOTE — ED Notes (Signed)
Pt came back to nursing station demanding the "three guys come out to hold" him so he "could get his "shot". Informed pt that he was not getting a shot and he needed to go back to his room, Pt then said not till he had his medicine and he was going to "do what" he had too to get his medicine. Explained to pt that he had no other medicine ordered at this time and he needed to go back to his room. He said he was leaving. Explained he was IVC'd and he could not, he said he was. At that time, I notified charge nurse to call Police and security. Pt was placed in handcuffs when he began to resist and disturb computer in room. EDP ordered pt Seroquel and was given along with prn dose of ativan.

## 2015-05-19 NOTE — ED Notes (Signed)
Placed pt on continuous pulse ox per EDP. sats 92-95% while sleeping. Hr 65-67. Pt remains asleep at this time. Sheriff at bedside, handcuffs remain on patient, leg restraints off at this time.

## 2015-05-19 NOTE — ED Notes (Signed)
Pt called nurse to room and asked if it was true that he would be given a "shot if" he "acted violent toward staff". Pt asked if it would be a "tranqilizer" because he wanted to "sleep" and he wanted to get the medicine. Nurse explained that if he acted out he would have the Police dept called and sitting with him at that point. tranquilizers would not be his option. Pt then stated "oh".

## 2015-05-19 NOTE — ED Notes (Signed)
Pt called out stating "he needed help". Stated he wanted out of the handcuffs and began to push against the bed frame with his feet and hands. Explained to pt that he was not leaving at this time and that he would not be receiving medication at this time for "acting out". Explained that that calm behavior would allow for him to have cuffs removed.

## 2015-05-19 NOTE — ED Notes (Signed)
Pt quiet at this time, appears to be sleeping at this time.

## 2015-05-19 NOTE — ED Notes (Signed)
Pt stating he can not go to sleep in handcuffs and requesting more medication. Also states she can not sleep with restless leg due to injections. Explained he will not manipulate staff any further. He has had rules explained to him for removal of handcuffs and also for administration of medication. Games developerAdvised sitter and police officers that pt has been made aware of rules and he was to adhere them.

## 2015-05-19 NOTE — ED Provider Notes (Addendum)
2:30 AM  Pt increasingly agitated. No improvement after oral medications and with multiple attempts to redirect patient. Patient had to be handcuffed to bed for his and safety and that of staff. Began  Kicking bad, increased agitation. Given Geodon IM. He is currently involuntarily committed. TTS has evaluated patient and they are waiting placement to an inpatient psychiatric facility.  Layla MawKristen N Joshalyn Ancheta, DO 05/19/15 0227   3:40 AM  Pt still very agitated, screaming after 20 mg of IM Geodon. We'll give 2 mg of IM Ativan. Police at bedside.  Ahmadou Bolz N Pharrah Rottman, DO 05/19/15 0340  6:00 AM  Pt finally resting after IM Ativan.  HD stable.    Layla MawKristen N Dezarae Mcclaran, DO 05/19/15 [phone]

## 2015-05-20 ENCOUNTER — Inpatient Hospital Stay (HOSPITAL_COMMUNITY)
Admission: AD | Admit: 2015-05-20 | Discharge: 2015-05-25 | DRG: 897 | Disposition: A | Discharge status: Home / Self Care | Payer: No Typology Code available for payment source | Source: Intra-hospital | Attending: Psychiatry | Admitting: Psychiatry

## 2015-05-20 ENCOUNTER — Encounter (HOSPITAL_COMMUNITY): Payer: Self-pay

## 2015-05-20 DIAGNOSIS — G4700 Insomnia, unspecified: Secondary | ICD-10-CM | POA: Diagnosis present

## 2015-05-20 DIAGNOSIS — F329 Major depressive disorder, single episode, unspecified: Secondary | ICD-10-CM | POA: Diagnosis present

## 2015-05-20 DIAGNOSIS — F1120 Opioid dependence, uncomplicated: Principal | ICD-10-CM | POA: Diagnosis present

## 2015-05-20 DIAGNOSIS — F410 Panic disorder [episodic paroxysmal anxiety] without agoraphobia: Secondary | ICD-10-CM | POA: Diagnosis present

## 2015-05-20 DIAGNOSIS — R45850 Homicidal ideations: Secondary | ICD-10-CM | POA: Diagnosis present

## 2015-05-20 DIAGNOSIS — F19288 Other psychoactive substance dependence with other psychoactive substance-induced disorder: Secondary | ICD-10-CM | POA: Insufficient documentation

## 2015-05-20 DIAGNOSIS — F1320 Sedative, hypnotic or anxiolytic dependence, uncomplicated: Secondary | ICD-10-CM | POA: Diagnosis present

## 2015-05-20 DIAGNOSIS — F1920 Other psychoactive substance dependence, uncomplicated: Secondary | ICD-10-CM

## 2015-05-20 DIAGNOSIS — F1123 Opioid dependence with withdrawal: Secondary | ICD-10-CM | POA: Insufficient documentation

## 2015-05-20 DIAGNOSIS — F1994 Other psychoactive substance use, unspecified with psychoactive substance-induced mood disorder: Secondary | ICD-10-CM | POA: Diagnosis present

## 2015-05-20 MED ORDER — CLONIDINE HCL 0.1 MG PO TABS
0.1000 mg | ORAL_TABLET | Freq: Every day | ORAL | Status: DC
  Filled 2015-05-20 (×2): qty 1

## 2015-05-20 MED ORDER — ROPINIROLE HCL 0.25 MG PO TABS
0.2500 mg | ORAL_TABLET | Freq: Every day | ORAL | Status: DC
  Administered 2015-05-20 – 2015-05-24 (×5): 0.25 mg via ORAL
  Filled 2015-05-20 (×8): qty 1
  Filled 2015-05-20: qty 14

## 2015-05-20 MED ORDER — HYDROXYZINE HCL 25 MG PO TABS
25.0000 mg | ORAL_TABLET | Freq: Four times a day (QID) | ORAL | Status: DC | PRN
  Administered 2015-05-21 – 2015-05-24 (×4): 25 mg via ORAL
  Filled 2015-05-20 (×2): qty 1
  Filled 2015-05-20: qty 30
  Filled 2015-05-20 (×2): qty 1

## 2015-05-20 MED ORDER — NAPROXEN 500 MG PO TABS
500.0000 mg | ORAL_TABLET | Freq: Two times a day (BID) | ORAL | Status: DC | PRN
  Administered 2015-05-23: 500 mg via ORAL
  Filled 2015-05-20: qty 1

## 2015-05-20 MED ORDER — METHOCARBAMOL 500 MG PO TABS
500.0000 mg | ORAL_TABLET | Freq: Three times a day (TID) | ORAL | Status: DC | PRN
  Administered 2015-05-20: 500 mg via ORAL

## 2015-05-20 MED ORDER — ZIPRASIDONE MESYLATE 20 MG IM SOLR: 20.0000 mg | INTRAMUSCULAR | Status: DC | PRN

## 2015-05-20 MED ORDER — MAGNESIUM HYDROXIDE 400 MG/5ML PO SUSP: 30.0000 mL | Freq: Every day | ORAL | Status: DC | PRN

## 2015-05-20 MED ORDER — LORAZEPAM 1 MG PO TABS
1.0000 mg | ORAL_TABLET | ORAL | Status: AC | PRN
  Administered 2015-05-23: 1 mg via ORAL
  Filled 2015-05-20: qty 1

## 2015-05-20 MED ORDER — CLONIDINE HCL 0.1 MG PO TABS
0.1000 mg | ORAL_TABLET | ORAL | Status: AC
  Administered 2015-05-23 – 2015-05-25 (×4): 0.1 mg via ORAL
  Filled 2015-05-20 (×4): qty 1

## 2015-05-20 MED ORDER — QUETIAPINE FUMARATE 100 MG PO TABS
100.0000 mg | ORAL_TABLET | Freq: Every day | ORAL | Status: DC
  Administered 2015-05-20 – 2015-05-24 (×5): 100 mg via ORAL
  Filled 2015-05-20 (×6): qty 1
  Filled 2015-05-20: qty 14
  Filled 2015-05-20: qty 1

## 2015-05-20 MED ORDER — ACETAMINOPHEN 325 MG PO TABS: 650.0000 mg | ORAL_TABLET | Freq: Four times a day (QID) | ORAL | Status: DC | PRN

## 2015-05-20 MED ORDER — CLONIDINE HCL 0.1 MG PO TABS
0.1000 mg | ORAL_TABLET | Freq: Four times a day (QID) | ORAL | Status: AC
  Administered 2015-05-20 – 2015-05-23 (×10): 0.1 mg via ORAL
  Filled 2015-05-20 (×11): qty 1

## 2015-05-20 MED ORDER — LOPERAMIDE HCL 2 MG PO CAPS: 2.0000 mg | ORAL_CAPSULE | ORAL | Status: DC | PRN

## 2015-05-20 MED ORDER — DICYCLOMINE HCL 20 MG PO TABS: 20.0000 mg | ORAL_TABLET | Freq: Four times a day (QID) | ORAL | Status: DC | PRN

## 2015-05-20 MED ORDER — ONDANSETRON 4 MG PO TBDP: 4.0000 mg | ORAL_TABLET | Freq: Four times a day (QID) | ORAL | Status: DC | PRN

## 2015-05-20 MED ORDER — ALUM & MAG HYDROXIDE-SIMETH 200-200-20 MG/5ML PO SUSP: 30.0000 mL | ORAL | Status: DC | PRN

## 2015-05-20 MED ORDER — OLANZAPINE 5 MG PO TBDP
5.0000 mg | ORAL_TABLET | Freq: Three times a day (TID) | ORAL | Status: DC | PRN
  Administered 2015-05-24: 5 mg via ORAL
  Filled 2015-05-20: qty 1

## 2015-05-20 NOTE — ED Notes (Signed)
Pt cooperative at this time.  Given breakfast tray.  Pt denies any needs.

## 2015-05-20 NOTE — ED Notes (Signed)
Pt sleeping soundly at this time.

## 2015-05-20 NOTE — ED Notes (Signed)
Report given to Marisue IvanLIz, [facility]

## 2015-05-20 NOTE — ED Notes (Signed)
[employer] here to transport pt,  

## 2015-05-20 NOTE — ED Notes (Signed)
Nicholas [name] called and stated [facility]. Accepted by Jennette KettleNeal to Dr. Dub MikesLugo. Pt cannot not come until 8 pm. Pt aware.

## 2015-05-20 NOTE — ED Notes (Signed)
Pt eating lunch at this time.  Voices no complaints.  [facility]

## 2015-05-20 NOTE — ED Notes (Signed)
Pt states that he is starting to feel anxious.  Requesting some Ativan for restlessness.  Pt noted to be sweating slightly.  Medicated as ordered.

## 2015-05-20 NOTE — ED Notes (Signed)
Pt has one [employer] black cell phone and one set of white ear buds that were placed in pt's belonging bag with [employer]

## 2015-05-20 NOTE — [facility] Counselor (Signed)
Informed that Pt need tele-psych. TTS consult will be removed and Psych consult will be put in.  Nicholas PhoenixBrandi Zayon Trulson, [facility] Triage Specialist

## 2015-05-20 NOTE — ED Notes (Signed)
Pt c/o nausea,

## 2015-05-20 NOTE — ED Provider Notes (Signed)
[facility] Will transfer stable.  Samuel JesterKathleen Jackalynn Art, DO 05/20/15 1845

## 2015-05-21 ENCOUNTER — Encounter (HOSPITAL_COMMUNITY): Payer: Self-pay | Admitting: Psychiatry

## 2015-05-21 DIAGNOSIS — F1910 Other psychoactive substance abuse, uncomplicated: Secondary | ICD-10-CM

## 2015-05-21 DIAGNOSIS — F329 Major depressive disorder, single episode, unspecified: Secondary | ICD-10-CM
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
## 2015-05-21 NOTE — Progress Notes (Signed)
D.  Took over Pt's care after admission process completed.  Pt given night time scheduled medications as well as medication to help him sleep.  Pt states insomnia is a very bad problem for him.  Pt introduced to room mate whom it turns out he has known since childhood.  A.  Pt provided with meal.  No questions voiced by Pt at this time. R.   Will continue to monitor.
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
## 2015-05-21 NOTE — Progress Notes (Signed)
Patient attended AA Speaker group tonight.  

## 2015-05-21 NOTE — [facility] Suicide Risk Assessment (Signed)
[facility]   Nursing information obtained from:  Patient, Review of record Demographic factors:  Male, Caucasian, Unemployed, Adolescent or young adult Current Mental Status:  Thoughts of violence towards others Loss Factors:  Loss of significant relationship Historical Factors:  Family history of mental illness or substance abuse, Impulsivity Risk Reduction Factors:  Living with another person, especially a relative Total Time spent with patient: 45 minutes Principal Problem: Substance induced mood disorder Diagnosis:   Patient Active Problem List   Diagnosis Date Noted  . Suicide attempt by drug ingestion [T50.902A]   . Polysubstance dependence including opioid type drug without complication, episodic abuse [F11.220] 01/08/2015  . Polysubstance dependence including opioid type drug, continuous use [F11.229] 03/18/2014  . Substance induced mood disorder [F19.94] 03/18/2014  . Opiate dependence [F11.20] 03/17/2014  . 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  World Science writerHealth Organization [facility](WHO). Score between 0-7:  no or low risk or alcohol related problems. Score between 8-15:  moderate risk of alcohol related problems. Score between 16-19:  high risk of alcohol related problems. Score 20 or above:  warrants further diagnostic evaluation for alcohol dependence and treatment.   CLINICAL FACTORS:   Alcohol/Substance Abuse/Dependencies  Psychiatric Specialty Exam: Physical Exam  ROS  Blood pressure 115/80, pulse 70, temperature 97.9 F (36.6 C), temperature source Oral, resp. rate 16, height 6\' 1"  (1.854 m), weight 103.42 kg (228 lb).Body mass index is 30.09 kg/(m^2).   COGNITIVE FEATURES THAT CONTRIBUTE TO RISK:  Closed-mindedness, Polarized thinking and  Thought constriction (tunnel vision)    SUICIDE RISK:   Moderate:  Frequent suicidal ideation with limited intensity, and duration, some specificity in terms of plans, no associated intent, good self-control, limited dysphoria/symptomatology, some risk factors present, and identifiable protective factors, including available and accessible social support.  PLAN OF CARE: Supportive approach/coping skills                              Polysubstance dependence; detox and work a relapse prevention plan                              Reassess and address the co morbidities  Medical Decision Making:  Review of Psycho-Social Stressors (1), Review or order clinical lab tests (1), Review of Medication Regimen & Side Effects (2) and Review of New Medication or Change in Dosage (2)  I certify that inpatient services furnished can reasonably be expected to improve the patient's condition.   Latalia Etzler A 05/21/2015, 10:04 PM

## 2015-05-21 NOTE — Progress Notes (Signed)
Patient in day room at the beginning of this shift. Sitting alone and no interactions noted between him and peers. He reported that he had a great day and that he is doing better. He denied SI/Hi and denied Hallucinations. His mood and affects flat and depressed. Writer encouraged and supported patient. Patient receptive to encouragement and support. Q 15 minute check continues as orderered for safety.

## 2015-05-21 NOTE — Tx Team (Signed)
Initial Interdisciplinary Treatment Plan   PATIENT STRESSORS: Marital or family conflict Substance abuse   PATIENT STRENGTHS: Ability for insight Average or above average intelligence General fund of knowledge Special hobby/interest   PROBLEM LIST: Problem List/Patient Goals Date to be addressed Date deferred Reason deferred Estimated date of resolution  Substance abuse 05/20/2015     Anger issues 73/2016     "I need help to manage my opiate use" 05/20/2015     Restless leg syndrone 05/20/2015                                    DISCHARGE CRITERIA:  Improved stabilization in mood, thinking, and/or behavior Need for constant or close observation no longer present Withdrawal symptoms are absent or subacute and managed without 24-hour nursing intervention  PRELIMINARY DISCHARGE PLAN: Attend PHP/IOP Participate in family therapy Return to previous living arrangement Return to previous work or school arrangements  PATIENT/FAMIILY INVOLVEMENT: This treatment plan has been presented to and reviewed with the patient, Roney MarionRyan E Rudden,  The patient and family have been given the opportunity to ask questions and make suggestions.  JEHU-APPIAH, Neko Mcgeehan K 05/21/2015, 12:03 AM

## 2015-05-21 NOTE — [facility] Group Notes (Signed)
   [facility]  05/21/2015  1:15 PM   Participation Quality: Alert, Appropriate and Oriented  Mood/Affect: Depressed and flat  Depression Rating: Did not rate  Anxiety Rating: Did not rate  Thoughts of Suicide: Pt denies SI/HI  Will you contract for safety? Yes  Current AVH: Pt denies  Plan for Discharge/Comments: Pt attended discharge planning group and actively participated in group. CSW provided pt with today's workbook. Patient plans to return home [facility]  Transportation Means: Pt reports access to transportation  Supports: No supports mentioned at this time  Samuella BruinKristin Kaicen Desena, MSW, [employer]LCSWA Clinical Social Worker [facility] [phone]

## 2015-05-21 NOTE — Progress Notes (Signed)
D:  Per pt self inventory pt reports sleeping fair, appetite fair, energy level normal, ability to pay attention poor, rates depression at a 6 out of 10, hopelessness at a 2.3 out of 10, anxiety at an 8.9 out of 10, denies SI/HI/AVH, goal today: "see if I can get my school books"      A:  Emotional support provided, Encouraged pt to continue with treatment plan and attend all group activities, q15 min checks maintained for safety.  R:  Pt is quiet during interaction, in room most of the morning, needs encouragement to go to groups, pleasant and cooperative with staff and other patients on the unit.

## 2015-05-22 NOTE — [facility] Group Notes (Signed)
[facility] Therapy 05/22/2015 1:15pm  Type of Therapy: Mental Health Association Presentation  Participation Level: Active  Participation Quality: Attentive  Affect: Appropriate  Cognitive: Oriented  Insight: Developing/Improving  Engagement in Therapy: Engaged  Modes of Intervention: Discussion, Education and Socialization  Summary of Progress/Problems: Mental Health Association (MHA) Speaker came to talk about his personal journey with substance abuse and addiction. The pt processed ways by which to relate to the speaker. MHA speaker provided handouts and educational information pertaining to groups and services offered by the [facility]. Pt was engaged in speaker's presentation and was receptive to resources provided.    Chad CordialLauren Carter, LCSWA 05/22/2015 1:37 PM

## 2015-05-22 NOTE — Clinical Social Work Note (Signed)
CSW met w patient, discussed referral options. Patient voiced ambivalence re stopping substances, says he needs a "safety blanket" of suboxone, most concerned about opiate abuse and wants help w that. Vacillates between expressing want to quit all substances and doing "whatever it takes, including getting on Vivitrol if I cannot drink" to stating that he sees nothing wrong w having "a few beers w my father watching football" or "smoking a joint while walking down the beach." Admits to being impulsive in his decisions. Asked for following referrals: [facility] (referral made, facility reviewing for admission possibly tomorrow, patient will need to complete phone screen), Kip Corrington at [facility] in [place] (not taking new suboxone patients, "wait list has not changed in over a year per receptionist", will not take referral), [facility] (no openings, but receptionist is asking [facility] if he will consider patient for clinic), [facility] (has openings, requires $325 administrative fee up front to cover first week of treatment, 2 weekly appts - one w MD and other for group, pt will need to call for phone screen [phone]). Patient states he has also contacted [facility] and says they are "not an option" due to upfront fees of $300 approx. Pt upset that he has lost job that begins today due to this hospitalization, however willing to remain inpt to assess medication stability. Awaiting information from referrals made.  Edwyna Shell, LCSW Clinical Social Worker

## 2015-05-22 NOTE — Progress Notes (Signed)
[facility] MD Progress Note  05/22/2015 7:22 PM Nicholas [name]  MRN:  [id number] Subjective:  Jahbari continues to describe hallucinatory type phenomena that happens the third or fourth day past quitting. States these episodes happen usually at night an he is awake and is like a picture being played in front of his eyes. He states he does not resist and observes. He does not think the situation with his stepfather is going to change but at this time does not think his mother is going to allow the step father to come by.  Principal Problem: Substance induced mood disorder Diagnosis:   Patient Active Problem List   Diagnosis Date Noted  . Suicide attempt by drug ingestion [T50.902A]   . Polysubstance dependence including opioid type drug without complication, episodic abuse [F11.220] 01/08/2015  . Polysubstance dependence including opioid type drug, continuous use [F11.229] 03/18/2014  . Substance induced mood disorder [F19.94] 03/18/2014  . Opiate dependence [F11.20] 03/17/2014  . 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 Migraine     Past Surgical History  Procedure Laterality Date  . Appendectomy     Family History: History reviewed. No pertinent family history. Social History:  History  Alcohol Use No     History  Drug Use  . Yes  . Special: Marijuana, Cocaine, Benzodiazepines, Oxycodone, Hydrocodone    Comment: opiates, pills    History   Social History  . Marital Status: Single    Spouse Name: N/A  . Number of Children: N/A  . Years of Education: N/A   Social History Main Topics  . Smoking status: Never Smoker   . Smokeless tobacco: Not on file  . Alcohol Use: No  . Drug Use: Yes    Special: Marijuana, Cocaine, Benzodiazepines, Oxycodone, Hydrocodone     Comment: opiates, pills  . Sexual Activity: Yes   Other Topics Concern  . None   Social History  Narrative   Additional History:    Sleep: Poor  Appetite:  Fair   Assessment:   Musculoskeletal: Strength & Muscle Tone: within normal limits Gait & Station: normal Patient leans: normal   Psychiatric Specialty Exam: Physical Exam  Review of Systems  Constitutional: Negative.   HENT: Negative.   Eyes: Negative.   Respiratory: Negative.   Cardiovascular: Negative.   Gastrointestinal: Negative.   Genitourinary: Negative.   Musculoskeletal: Negative.   Skin: Negative.   Neurological: Negative.   Endo/Heme/Allergies: Negative.   Psychiatric/Behavioral: Positive for depression. The patient is nervous/anxious and has insomnia.     Blood pressure 137/80, pulse 70, temperature 97.5 F (36.4 C), temperature source Oral, resp. rate 14, height 6\' 1"  (1.854 m), weight 103.42 kg (228 lb).Body mass index is 30.09 kg/(m^2).  General Appearance: Fairly Groomed  Patent attorney::  Fair  Speech:  Clear and Coherent  Volume:  Normal  Mood:  Anxious, Depressed and worried  Affect:  Restricted  Thought Process:  Coherent and Goal Directed  Orientation:  Full (Time, Place, and Person)  Thought Content:  symptoms he experiences worries concerns   Suicidal Thoughts:  No  Homicidal Thoughts:  No  Memory:  Immediate;   Fair Recent;   Fair Remote;   Fair  Judgement:  Fair  Insight:  Present  Psychomotor Activity:  Restlessness  Concentration:  Fair  Recall:  [employer] of Knowledge:Fair  Language: Fair  Akathisia:  No  Handed:  Right  AIMS (if indicated):     Assets:  Desire for Improvement Housing  ADL's:  Intact  Cognition: WNL  Sleep:  Number of Hours: 4.5     Current Medications: Current Facility-Administered Medications  Medication Dose Route Frequency Provider Last Rate Last Dose  . acetaminophen (TYLENOL) tablet 650 mg  650 mg Oral Q6H PRN Kerry Hough, PA-C      . alum & mag hydroxide-simeth (MAALOX/MYLANTA) 200-200-20 MG/5ML suspension 30 mL  30 mL Oral Q4H PRN  Kerry Hough, PA-C      . cloNIDine (CATAPRES) tablet 0.1 mg  0.1 mg Oral QID Kerry Hough, PA-C   0.1 mg at 05/22/15 1814   Followed by  . [START ON 05/23/2015] cloNIDine (CATAPRES) tablet 0.1 mg  0.1 mg Oral BH-qamhs Spencer E Simon, PA-C       Followed by  . [START ON 05/26/2015] cloNIDine (CATAPRES) tablet 0.1 mg  0.1 mg Oral QAC breakfast Kerry Hough, PA-C      . dicyclomine (BENTYL) tablet 20 mg  20 mg Oral Q6H PRN Kerry Hough, PA-C      . hydrOXYzine (ATARAX/VISTARIL) tablet 25 mg  25 mg Oral Q6H PRN Kerry Hough, PA-C   25 mg at 05/21/15 1703  . loperamide (IMODIUM) capsule 2-4 mg  2-4 mg Oral PRN Kerry Hough, PA-C      . OLANZapine zydis (ZYPREXA) disintegrating tablet 5 mg  5 mg Oral Q8H PRN Kerry Hough, PA-C       And  . LORazepam (ATIVAN) tablet 1 mg  1 mg Oral PRN Kerry Hough, PA-C       And  . ziprasidone (GEODON) injection 20 mg  20 mg Intramuscular PRN Kerry Hough, PA-C      . magnesium hydroxide (MILK OF MAGNESIA) suspension 30 mL  30 mL Oral Daily PRN Kerry Hough, PA-C      . methocarbamol (ROBAXIN) tablet 500 mg  500 mg Oral Q8H PRN Kerry Hough, PA-C   500 mg at 05/20/15 2321  . naproxen (NAPROSYN) tablet 500 mg  500 mg Oral BID PRN Kerry Hough, PA-C      . ondansetron (ZOFRAN-ODT) disintegrating tablet 4 mg  4 mg Oral Q6H PRN Kerry Hough, PA-C      . QUEtiapine (SEROQUEL) tablet 100 mg  100 mg Oral QHS Kerry Hough, PA-C   100 mg at 05/21/15 2207  . rOPINIRole (REQUIP) tablet 0.25 mg  0.25 mg Oral QHS Kerry Hough, PA-C   0.25 mg at 05/21/15 2207    Lab Results: No results found for this or any previous visit (from the past 48 hour(s)).  Physical Findings: AIMS: Facial and Oral Movements Muscles of Facial Expression: None, normal Lips and Perioral Area: None, normal Jaw: None, normal Tongue: None, normal,Extremity Movements Upper (arms, wrists, hands, fingers): None, normal Lower (legs, knees, ankles, toes): None,  normal, Trunk Movements Neck, shoulders, hips: None, normal, Overall Severity Severity of abnormal movements (highest score from questions above): None, normal Incapacitation due to abnormal movements: None, normal Patient's awareness of abnormal movements (rate only patient's report): No Awareness, Dental Status Current problems with teeth and/or dentures?: No Does patient usually wear dentures?: No  CIWA:    COWS:  COWS Total Score: 2  Treatment Plan Summary: Daily contact with patient to assess and evaluate symptoms and progress in treatment and Medication management Supportive approach/coping skills Opiate dependence; continue the detox protocol/work a  relapse prevention plan Insomnia; pursue the Seroquel further consider increasing the Seroquel to 200 mg HS Hallucinatory phenomena; reassess further as we along with the detox CBT/mindfulness   Medical Decision Making:  Review of Psycho-Social Stressors (1), Review of Medication Regimen & Side Effects (2) and Review of New Medication or Change in Dosage (2)     Arnell Slivinski A 05/22/2015, 7:22 PM

## 2015-05-22 NOTE — Progress Notes (Signed)
Recreation Therapy Notes  Animal-Assisted Activity (AAA) Program Checklist/Progress Notes Patient Eligibility Criteria Checklist & Daily Group note for Rec Tx Intervention  Date: 07.05.16 Time: 230 pm Location: 400 Hall Dayroom   AAA/T Program Assumption of Risk Form signed by Patient/ or Parent Legal Guardian yes  Patient is free of allergies or sever asthma yes  Patient reports no fear of animals yes  Patient reports no history of cruelty to animalsyes  Patient understands his/her participation is voluntary yes  Patient washes hands before animal contact yes  Patient washes hands after animal contact yes  Education: Hand Washing, Appropriate Animal Interaction   Education Outcome: Acknowledges understanding/In group clarification offered/Needs additional education.   Clinical Observations/Feedback: Did not attend group.   Caroll RancherMarjette Kalena Mander, LRT/CTRS         Caroll RancherLindsay, Keyanni Whittinghill A 05/22/2015 4:41 PM

## 2015-05-22 NOTE — Progress Notes (Signed)
D:  Per pt self inventory pt reports sleeping good, appetite fair, energy level normal, ability to pay attention good, rates depression at a 3.7 out of 10, hopelessness at a 2.7 out of 10, anxiety at a 7.8 out of 10, pt is quiet/flat during interaction, is complaining of some withdrawal symptoms and is on the clonidine protocol, pt states that he does not know what a goal is for him today, and on his self inventory he wrote in response to the question-What will you do today to help meet your goal:  "1 gram of blueberry kush", denies SI/HI/AVH.    A:  Emotional support provided, Encouraged pt to continue with treatment plan and attend all group activities, q15 min checks maintained for safety.  R:  Pt is going to groups but is very quiet, minimal interaction with others on the unit and minimal participation in groups, pleasant and cooperative with staff and other patients on the unit.

## 2015-05-22 NOTE — Tx Team (Signed)
Interdisciplinary Treatment Plan Update (Adult)  Date:  05/22/2015 Time Reviewed:  7:33 PM  Progress in Treatment: Attending groups: Yes. Participating in groups:  Yes. Taking medication as prescribed:  Yes. Tolerating medication:  Yes. Family/Significant othe contact made:  No, will contact:  collateral w patient permission Patient understands diagnosis:  Yes. Discussing patient identified problems/goals with staff:  Yes. Medical problems stabilized or resolved:  Yes. Denies suicidal/homicidal ideation: Yes. Issues/concerns per patient self-inventory:  No. Other:  New problem(s) identified: Yes, Describe:  withdrawal management  Discharge Plan or Barriers: none  Reason for Continuation of Hospitalization: Depression Suicidal ideation Withdrawal symptoms  Comments: 05/22/15:  Patient continues to progress through withdrawal, CIWA and COWS reducing, some unstable vital signs, aftercare follow up appt made w current provider.    Estimated length of stay: 3 - 5 days  New goal(s): continued management of withdrawal and medication stabilization  Review of initial/current patient goals per problem list:  See initial plan of care   Attendees: Patient:   7/5/20167:33 PM  Family:   7/5/20167:33 PM  Physician:  Jola BaptistI Lugo MD 7/5/20167:33 PM  Nursing:   Janeece RiggersAndrea RN 7/5/20167:33 PM  Case Manager:  Santa GeneraAnne Parisa Pinela LCSW 7/5/20167:33 PM  Counselor:   7/5/20167:33 PM  Other:  Sondra BargesJ CLark, RN UR 7/5/20167:33 PM  Other:  Tedra CoupeV Enoch, [facility] TCT 7/5/20167:33 PM  Other:   7/5/20167:33 PM  Other:  7/5/20167:33 PM  Other:  7/5/20167:33 PM  Other:  7/5/20167:33 PM  Other:  7/5/20167:33 PM  Other:  7/5/20167:33 PM  Other:  7/5/20167:33 PM  Other:   7/5/20167:33 PM   Scribe for Treatment Team:   Sallee Langeunningham, Marielena Harvell C, 05/22/2015, 7:33 PM

## 2015-05-22 NOTE — Progress Notes (Signed)
D: Nicholas RossettiRyan has been calm and cooperative on unit tonight. He denies SI/HI/AVH and pain. He verbalized no needs when asked.  A: Meds given as ordered. Q15 safety checks maintained. Support/encouragement offered. R: Pt remains free from harm and continues with treatment. Will continue to monitor for needs/safety.

## 2015-05-22 NOTE — Progress Notes (Signed)
[facility] Group Notes:  (Nursing/MHT/Case Management/Adjunct)  Date:  05/22/2015  Time:  11:54 PM  Type of Therapy:  Psychoeducational Skills  Participation Level:  Active  Participation Quality:  Appropriate    Affect:  Appropriate  Cognitive:  Appropriate  Insight:  Appropriate  Engagement in Group:  Engaged  Modes of Intervention:  Discussion  Summary of Progress/Problems: Tonight in wrap up group Nicholas [name] said that his day has been better than the day before he found out when he is going to be discharged and the other patients have helped him to feel more comfortable.  [name] N Loki Wuthrich 05/22/2015, 11:54 PM

## 2015-05-23 DIAGNOSIS — F1994 Other psychoactive substance use, unspecified with psychoactive substance-induced mood disorder: Secondary | ICD-10-CM

## 2015-05-23 DIAGNOSIS — F1123 Opioid dependence with withdrawal: Secondary | ICD-10-CM

## 2015-05-23 DIAGNOSIS — F11222 Opioid dependence with intoxication with perceptual disturbance: Secondary | ICD-10-CM

## 2015-05-23 NOTE — Progress Notes (Addendum)
Pt has a bizarre affect this am. He told the writer that he enjoys when he comes off drugs because he has very vivid hallucinations. He stated he had a [place] with lots of money and a white cell phone. Pt did c/o a headache and was given 250mg  of naprosyn. Pt denies SI and HI and contracts for safety. Pt rates his depression and anxiety a 0/0 today. 10:30am-pt stated his headache is gone now. 12noon-Pt stated his anxiety was really bad and was given 1mg  of ativan and 25mg  of visteral.3pm-Pt stated he feels much better and less anxious.

## 2015-05-23 NOTE — Progress Notes (Signed)
Recreation Therapy Notes  Date: 07.06.16 Time: 930 am Location: 300 Hall Group Room  Group Topic: Stress Management  Goal Area(s) Addresses:  Patient will verbalize importance of using healthy stress management.  Patient will identify positive emotions associated with healthy stress management.   Intervention: Stress Management  Activity :  Guided Imagery.  LRT introduced and educated patients on the stress management technique of guided imagery.  [name] script was used to deliver the technique to patients.  Patients were asked to follow the script read [name] loud by LRT to engage in practicing the stress management technique.  Education:  Stress Management, Discharge Planning.   Education Outcome: Acknowledges edcuation/In group clarification offered/Needs additional education  Clinical Observations/Feedback: Did not attend group.    Nicholas [name], LRT/CTRS         Nicholas [name] 05/23/2015 3:44 PM 

## 2015-05-23 NOTE — [facility] Group Notes (Signed)
[facility]  05/23/2015 8:45 AM  Pt did not attend, declined invitation.   Chad CordialLauren Carter, LCSWA 05/23/2015 10:18 AM

## 2015-05-23 NOTE — Progress Notes (Signed)
[facility] MD Progress Note  05/23/2015 7:17 PM Nicholas [name]  MRN:  [id number] Subjective:  Nicholas [name] is still having the hallicinatory experiences. According to his experience tonight is probably the last night he will be experiencing this. States he is committed to abstinence. Will not go back to using as he states that these experiences are not always pleasant and he is getting tired of going trough it. States his mother continues to say that she will not allow his step father back in the house Principal Problem: Substance induced mood disorder Diagnosis:   Patient Active Problem List   Diagnosis Date Noted  . Suicide attempt by drug ingestion [T50.902A]   . Polysubstance dependence including opioid type drug without complication, episodic abuse [F11.220] 01/08/2015  . Polysubstance dependence including opioid type drug, continuous use [F11.229] 03/18/2014  . Substance induced mood disorder [F19.94] 03/18/2014  . Opiate dependence [F11.20] 03/17/2014  . 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 Migraine     Past Surgical History  Procedure Laterality Date  . Appendectomy     Family History: History reviewed. No pertinent family history. Social History:  History  Alcohol Use No     History  Drug Use  . Yes  . Special: Marijuana, Cocaine, Benzodiazepines, Oxycodone, Hydrocodone    Comment: opiates, pills    History   Social History  . Marital Status: Single    Spouse Name: N/A  . Number of Children: N/A  . Years of Education: N/A   Social History Main Topics  . Smoking status: Never Smoker   . Smokeless tobacco: Not on file  . Alcohol Use: No  . Drug Use: Yes    Special: Marijuana, Cocaine, Benzodiazepines, Oxycodone, Hydrocodone     Comment: opiates, pills  . Sexual Activity: Yes   Other Topics Concern  . None   Social History Narrative   Additional  History:    Sleep: Poor  Appetite:  Fair   Assessment:   Musculoskeletal: Strength & Muscle Tone: within normal limits Gait & Station: normal Patient leans: normal   Psychiatric Specialty Exam: Physical Exam  ROS  Blood pressure 114/70, pulse 82, temperature 98.9 F (37.2 C), temperature source Oral, resp. rate 18, height  (1.854 m), weight 103.42 kg (228 lb).Body mass index is 30.09 kg/(m^2).  General Appearance: Fairly Groomed  Patent attorney::  Fair  Speech:  Clear and Coherent  Volume:  Normal  Mood:  Anxious  Affect:  Restricted  Thought Process:  Coherent and Goal Directed  Orientation:  Full (Time, Place, and Person)  Thought Content:  symptoms events worries concerns  Suicidal Thoughts:  No  Homicidal Thoughts:  No  Memory:  Immediate;   Fair Recent;   Fair Remote;   Fair  Judgement:  Fair  Insight:  Present and Shallow  Psychomotor Activity:  Decreased  Concentration:  Fair  Recall:  [employer] of Knowledge:Fair  Language: Fair  Akathisia:  No  Handed:  Right  AIMS (if indicated):     Assets:  Desire for Improvement  ADL's:  Intact  Cognition: WNL  Sleep:  Number of Hours: 4.5     Current Medications: Current Facility-Administered Medications  Medication Dose Route Frequency Provider Last Rate Last Dose  . acetaminophen (TYLENOL) tablet 650 mg  650 mg Oral Q6H PRN Kerry Hough, PA-C      . alum &  mag hydroxide-simeth (MAALOX/MYLANTA) 200-200-20 MG/5ML suspension 30 mL  30 mL Oral Q4H PRN Kerry HoughSpencer E Simon, PA-C      . cloNIDine (CATAPRES) tablet 0.1 mg  0.1 mg Oral BH-qamhs Kerry HoughSpencer E Simon, PA-C       Followed by  . [START ON 05/26/2015] cloNIDine (CATAPRES) tablet 0.1 mg  0.1 mg Oral QAC breakfast Kerry HoughSpencer E Simon, PA-C   0.1 mg at 05/23/15 [id number]  . dicyclomine (BENTYL) tablet 20 mg  20 mg Oral Q6H PRN Kerry HoughSpencer E Simon, PA-C      . hydrOXYzine (ATARAX/VISTARIL) tablet 25 mg  25 mg Oral Q6H PRN Kerry HoughSpencer E Simon, PA-C   25 mg at 05/23/15 1211  .  loperamide (IMODIUM) capsule 2-4 mg  2-4 mg Oral PRN Kerry HoughSpencer E Simon, PA-C      . magnesium hydroxide (MILK OF MAGNESIA) suspension 30 mL  30 mL Oral Daily PRN Kerry HoughSpencer E Simon, PA-C      . methocarbamol (ROBAXIN) tablet 500 mg  500 mg Oral Q8H PRN Kerry HoughSpencer E Simon, PA-C   500 mg at 05/20/15 2321  . naproxen (NAPROSYN) tablet 500 mg  500 mg Oral BID PRN Kerry HoughSpencer E Simon, PA-C   500 mg at 05/23/15 0800  . OLANZapine zydis (ZYPREXA) disintegrating tablet 5 mg  5 mg Oral Q8H PRN Kerry HoughSpencer E Simon, PA-C       And  . ziprasidone (GEODON) injection 20 mg  20 mg Intramuscular PRN Kerry HoughSpencer E Simon, PA-C      . ondansetron (ZOFRAN-ODT) disintegrating tablet 4 mg  4 mg Oral Q6H PRN Kerry HoughSpencer E Simon, PA-C      . QUEtiapine (SEROQUEL) tablet 100 mg  100 mg Oral QHS Kerry HoughSpencer E Simon, PA-C   100 mg at 05/22/15 2215  . rOPINIRole (REQUIP) tablet 0.25 mg  0.25 mg Oral QHS Kerry HoughSpencer E Simon, PA-C   0.25 mg at 05/22/15 2134    Lab Results: No results found for this or any previous visit (from the past 48 hour(s)).  Physical Findings: AIMS: Facial and Oral Movements Muscles of Facial Expression: None, normal Lips and Perioral Area: None, normal Jaw: None, normal Tongue: None, normal,Extremity Movements Upper (arms, wrists, hands, fingers): None, normal Lower (legs, knees, ankles, toes): None, normal, Trunk Movements Neck, shoulders, hips: None, normal, Overall Severity Severity of abnormal movements (highest score from questions above): None, normal Incapacitation due to abnormal movements: None, normal Patient's awareness of abnormal movements (rate only patient's report): No Awareness, Dental Status Current problems with teeth and/or dentures?: No Does patient usually wear dentures?: No  CIWA:    COWS:  COWS Total Score: 2  Treatment Plan Summary: Daily contact with patient to assess and evaluate symptoms and progress in treatment and Medication management Supportive approach/coping skills Opioid dependence;  continue the opiate detox protocol Substance induced mood disorder; will continue to monitor/continue using the Seroquel 100 mg HS Although not sleeping that well Alycia RossettiRyan states he feels OK the way the medications are working,does not want a higher dose Work a relapse prevention plan CBT/mindfulness Medical Decision Making:  Review of Psycho-Social Stressors (1) and Review of Medication Regimen & Side Effects (2)     Savannah Erbe A 05/23/2015, 7:17 PM

## 2015-05-23 NOTE — [facility] Group Notes (Signed)
[facility] LCSW Group Therapy 05/23/2015 1:15 PM  Type of Therapy: Group Therapy- Emotion Regulation  Participation Level: Active   Participation Quality:  Appropriate  Affect: Appropriate  Cognitive: Alert and Oriented   Insight:  Developing/Improving  Engagement in Therapy: Developing/Improving and Engaged   Modes of Intervention: Clarification, Confrontation, Discussion, Education, Exploration, Limit-setting, Orientation, Problem-solving, Rapport Building, Dance movement psychotherapisteality Testing, Socialization and Support  Summary of Progress/Problems: The topic for group today was emotional regulation. This group focused on both positive and negative emotion identification and allowed group members to process ways to identify feelings, regulate negative emotions, and find healthy ways to manage internal/external emotions. Group members were asked to reflect on a time when their reaction to an emotion led to a negative outcome and explored how alternative responses using emotion regulation would have benefited them. Group members were also asked to discuss a time when emotion regulation was utilized when a negative emotion was experienced. Pt participated in group discussion, accurately identifying emotion regulation. Pt was receptive to feedback from peers and offered insightful suggestions to help regulate emotions such as daily meditation.    Chad CordialLauren Carter, LCSWA 05/23/2015 4:35 PM

## 2015-05-23 NOTE — Progress Notes (Signed)
RN 2300 Shift  D: Pt in bed resting with eyes closed. Respirations even and unlabored. Pt appears to be in no signs of distress at this time. A: Q3415min checks remains for this pt. R: Pt remains safe at this time.

## 2015-05-24 LAB — CBC
HCT: 39.2 % (ref 39.0–52.0)
Hemoglobin: 13.2 g/dL (ref 13.0–17.0)
MCH: 31.6 pg (ref 26.0–34.0)
MCHC: 33.7 g/dL (ref 30.0–36.0)
MCV: 93.8 fL (ref 78.0–100.0)
Platelets: 229 10*3/uL (ref 150–400)
RBC: 4.18 MIL/uL — ABNORMAL LOW (ref 4.22–5.81)
RDW: 12.6 % (ref 11.5–15.5)
WBC: 10.7 10*3/uL — ABNORMAL HIGH (ref 4.0–10.5)

## 2015-05-24 NOTE — Progress Notes (Signed)
[facility] MD Progress Note  05/24/2015 10:50 PM Nicholas [name]  MRN:  [id number] Subjective:  Nicholas [name] stats that he had the same hallucinatory experience last night but that he hopes it was the last episode. States that given his past experience that should be it. He states that this time around he has felt better than other times he has been here. Sees this as progress Principal Problem: Substance induced mood disorder Diagnosis:   Patient Active Problem List   Diagnosis Date Noted  . Suicide attempt by drug ingestion [T50.902A]   . Polysubstance dependence including opioid type drug without complication, episodic abuse [F11.220] 01/08/2015  . Polysubstance dependence including opioid type drug, continuous use [F11.229] 03/18/2014  . Substance induced mood disorder [F19.94] 03/18/2014  . Opiate dependence [F11.20] 03/17/2014  . 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 Migraine     Past Surgical History  Procedure Laterality Date  . Appendectomy     Family History: History reviewed. No pertinent family history. Social History:  History  Alcohol Use No     History  Drug Use  . Yes  . Special: Marijuana, Cocaine, Benzodiazepines, Oxycodone, Hydrocodone    Comment: opiates, pills    History   Social History  . Marital Status: Single    Spouse Name: N/A  . Number of Children: N/A  . Years of Education: N/A   Social History Main Topics  . Smoking status: Never Smoker   . Smokeless tobacco: Not on file  . Alcohol Use: No  . Drug Use: Yes    Special: Marijuana, Cocaine, Benzodiazepines, Oxycodone, Hydrocodone     Comment: opiates, pills  . Sexual Activity: Yes   Other Topics Concern  . None   Social History Narrative   Additional History:    Sleep: Fair  Appetite:  Fair   Assessment:   Musculoskeletal: Strength & Muscle Tone: within normal limits Gait  & Station: normal Patient leans: normal   Psychiatric Specialty Exam: Physical Exam  Review of Systems  Constitutional: Negative.   HENT: Negative.   Eyes: Negative.   Respiratory: Negative.   Cardiovascular: Negative.   Gastrointestinal: Negative.   Genitourinary: Negative.   Musculoskeletal: Negative.   Skin: Negative.   Neurological: Negative.   Endo/Heme/Allergies: Negative.   Psychiatric/Behavioral: Positive for substance abuse. The patient is nervous/anxious.     Blood pressure 146/77, pulse 56, temperature 97.9 F (36.6 C), temperature source Oral, resp. rate 17, height 6\' 1"  (1.854 m), weight 103.42 kg (228 lb).Body mass index is 30.09 kg/(m^2).  General Appearance: Fairly Groomed  Patent attorney::  Fair  Speech:  Clear and Coherent  Volume:  Normal  Mood:  Anxious  Affect:  Restricted  Thought Process:  Coherent and Goal Directed  Orientation:  Full (Time, Place, and Person)  Thought Content:  events worries concerns  Suicidal Thoughts:  No  Homicidal Thoughts:  No  Memory:  Immediate;   Fair Recent;   Fair Remote;   Fair  Judgement:  Fair  Insight:  Present  Psychomotor Activity:  Restlessness  Concentration:  Fair  Recall:  [employer] of Knowledge:Fair  Language: Fair  Akathisia:  No  Handed:  Right  AIMS (if indicated):     Assets:  Desire for Improvement Housing  ADL's:  Intact  Cognition: WNL  Sleep:  Number of Hours: 5     Current Medications: Current  Facility-Administered Medications  Medication Dose Route Frequency Provider Last Rate Last Dose  . acetaminophen (TYLENOL) tablet 650 mg  650 mg Oral Q6H PRN Kerry Hough, PA-C      . alum & mag hydroxide-simeth (MAALOX/MYLANTA) 200-200-20 MG/5ML suspension 30 mL  30 mL Oral Q4H PRN Kerry Hough, PA-C      . cloNIDine (CATAPRES) tablet 0.1 mg  0.1 mg Oral BH-qamhs Mena Goes Simon, PA-C   0.1 mg at 05/24/15 2201   Followed by  . [START ON 05/26/2015] cloNIDine (CATAPRES) tablet 0.1 mg  0.1 mg  Oral QAC breakfast Kerry Hough, PA-C   0.1 mg at 05/23/15 4098  . dicyclomine (BENTYL) tablet 20 mg  20 mg Oral Q6H PRN Kerry Hough, PA-C      . hydrOXYzine (ATARAX/VISTARIL) tablet 25 mg  25 mg Oral Q6H PRN Kerry Hough, PA-C   25 mg at 05/24/15 2105  . loperamide (IMODIUM) capsule 2-4 mg  2-4 mg Oral PRN Kerry Hough, PA-C      . magnesium hydroxide (MILK OF MAGNESIA) suspension 30 mL  30 mL Oral Daily PRN Kerry Hough, PA-C      . methocarbamol (ROBAXIN) tablet 500 mg  500 mg Oral Q8H PRN Kerry Hough, PA-C   500 mg at 05/20/15 2321  . naproxen (NAPROSYN) tablet 500 mg  500 mg Oral BID PRN Kerry Hough, PA-C   500 mg at 05/23/15 0800  . OLANZapine zydis (ZYPREXA) disintegrating tablet 5 mg  5 mg Oral Q8H PRN Kerry Hough, PA-C   5 mg at 05/24/15 2200   And  . ziprasidone (GEODON) injection 20 mg  20 mg Intramuscular PRN Kerry Hough, PA-C      . ondansetron (ZOFRAN-ODT) disintegrating tablet 4 mg  4 mg Oral Q6H PRN Kerry Hough, PA-C      . QUEtiapine (SEROQUEL) tablet 100 mg  100 mg Oral QHS Kerry Hough, PA-C   100 mg at 05/24/15 2201  . rOPINIRole (REQUIP) tablet 0.25 mg  0.25 mg Oral QHS Kerry Hough, PA-C   0.25 mg at 05/24/15 2201    Lab Results:  Results for orders placed or performed during the hospital encounter of 05/20/15 (from the past 48 hour(s))  CBC     Status: Abnormal   Collection Time: 05/24/15  6:24 AM  Result Value Ref Range   WBC 10.7 (H) 4.0 - 10.5 K/uL   RBC 4.18 (L) 4.22 - 5.81 MIL/uL   Hemoglobin 13.2 13.0 - 17.0 g/dL   HCT 11.9 14.7 - 82.9 %   MCV 93.8 78.0 - 100.0 fL   MCH 31.6 26.0 - 34.0 pg   MCHC 33.7 30.0 - 36.0 g/dL   RDW 56.2 13.0 - 86.5 %   Platelets 229 150 - 400 K/uL    Comment: Performed at [facility]    Physical Findings: AIMS: Facial and Oral Movements Muscles of Facial Expression: None, normal Lips and Perioral Area: None, normal Jaw: None, normal Tongue: None, normal,Extremity  Movements Upper (arms, wrists, hands, fingers): None, normal Lower (legs, knees, ankles, toes): None, normal, Trunk Movements Neck, shoulders, hips: None, normal, Overall Severity Severity of abnormal movements (highest score from questions above): None, normal Incapacitation due to abnormal movements: None, normal Patient's awareness of abnormal movements (rate only patient's report): No Awareness, Dental Status Current problems with teeth and/or dentures?: No Does patient usually wear dentures?: No  CIWA:    COWS:  COWS Total Score: 3  Treatment Plan Summary: Daily contact with patient to assess and evaluate symptoms and progress in treatment and Medication management Supportive approach/coping skills Polysubstance dependence; complete the detox/work a relapse prevention plan Mood instability; will continue the Seroquel 100 mg  Work with CBT/mimdfulness Medical Decision Making:  Review of Psycho-Social Stressors (1) and Review of Medication Regimen & Side Effects (2)     Devine Klingel A 05/24/2015, 10:50 PM

## 2015-05-24 NOTE — Plan of Care (Signed)
Problem: Ineffective individual coping Goal: STG: Patient will remain free from self harm Outcome: Progressing Patient remains free from self harm. 15 minute checks continued per protocol for patient safety.   Problem: Alteration in mood & ability to function due to Goal: LTG-Pt reports reduction in suicidal thoughts (Patient reports reduction in suicidal thoughts and is able to verbalize a safety plan for whenever patient is feeling suicidal)  Outcome: Progressing Patient denies having any suicidal thoughts today.  Problem: Aggression Towards others,Towards Self, and or Destruction Goal: STG-Patient will comply with prescribed medication regimen (Patient will comply with prescribed medication regimen)  Outcome: Progressing Patient has adhered to medication regimen today with ease.

## 2015-05-24 NOTE — [facility] Group Notes (Signed)
[facility] LCSW Group Therapy 05/24/2015  1:15 pm   Type of Therapy: Group Therapy Participation Level: Active  Participation Quality: Attentive, Sharing   Affect: Depressed and Flat  Cognitive: Alert and Oriented  Insight: Developing/Improving   Engagement in Therapy: Developing/Improving  Modes of Intervention: Clarification, Confrontation, Discussion, Education, Exploration, Limit-setting, Orientation, Problem-solving, Rapport Building, Dance movement psychotherapisteality Testing, Socialization and Support  Summary of Progress/Problems: The topic for group was balance in life. Today's group focused on defining balance in one's own words, identifying things that can knock one off balance, and exploring healthy ways to maintain balance in life. Group members were asked to provide an example of a time when they felt off balance, describe how they handled that situation,and process healthier ways to regain balance in the future. Group members were asked to share the most important tool for maintaining balance that they [facility] and how they plan to apply this method after discharge. Nicholas [name] shared that he currently lacks any spiritual aspect in his life and is interested in studying Buddishm. Patient received support from other group members.   Nicholas Bernatherine C Mariusz Jubb, LCSW

## 2015-05-24 NOTE — [facility] Group Notes (Signed)
[facility] Group Notes:  (Nursing/MHT/Case Management/Adjunct)  Date:  05/24/2015  Time:  0900am  Type of Therapy:  Nurse Education  Participation Level:  Did Not Attend  Participation Quality:  Did not attend  Affect:  Did not attend  Cognitive:  Did not attend  Insight:  None  Engagement in Group:  Did not attend  Modes of Intervention:  Discussion, Education and Support  Summary of Progress/Problems: Patient was invited to group. Pt did not attend and remained in bed resting.  Nicholas [name], Boomer Winders A 05/24/2015, 11:14 AM

## 2015-05-24 NOTE — Progress Notes (Signed)
Pt was observed in the dayroom most of the evening playing cards with some of his peers.  He denies SI/HI/AVH at this time.  He told Clinical research associatewriter about the bizarre vivid dreams he had last night saying that this happens a lot when he is withdrawing.  He says he is [facility].  He says he intends to stay clean this time because he is tired of living this kind of life.  Pt was pleasant and cooperative with Clinical research associatewriter. He voiced no needs or concerns at this time.  Support and encouragement offered.  Safety maintained with q15 minute checks.

## 2015-05-24 NOTE — Progress Notes (Signed)
D: Patient is alert and oriented. Pt's mood and affect are anxious. Pt denies SI/HI and AVH. Pt forwards little with RN throughout the day, avoidant at times. Pt rates depression 2.7/10, hopelessness 1.8/10, anxiety 9.27/10. Pt appears to be managing anxiety with coping skills today. Pt reports "wild dreams" last night that he is wanting to discuss with the provider. Pt experiencing HTN today, denies symptoms (See docflowsheet-vitals). Pt is attending some unit groups today. A: Active listening by RN. Encouragement/Support provided to pt. Will reassess/monitor BP. Medication education reviewed with pt. Scheduled medications administered per providers orders (See MAR). 15 minute checks continued per protocol for patient safety.  R: Patient cooperative and receptive to nursing interventions. Pt remains safe.

## 2015-05-24 NOTE — Progress Notes (Signed)
Pt was invited to group, however chose not to attend and stayed in his room.

## 2015-05-25 DIAGNOSIS — F19288 Other psychoactive substance dependence with other psychoactive substance-induced disorder: Secondary | ICD-10-CM | POA: Insufficient documentation

## 2015-05-25 DIAGNOSIS — F1123 Opioid dependence with withdrawal: Secondary | ICD-10-CM | POA: Insufficient documentation

## 2015-05-25 MED ORDER — QUETIAPINE FUMARATE 100 MG PO TABS: 100.0000 mg | ORAL_TABLET | Freq: Every day | ORAL | Status: DC

## 2015-05-25 NOTE — Progress Notes (Signed)
Pt has been in his room in bed most of the evening.  He got up around 2100 to get a snack and get his medications.  He stayed up for a short time, then he went back to his room.  He denies SI/HI/AVH.  Conversation with pt was minimal.  He voiced no needs or concerns.  He makes his needs known to staff.  Support and encouragement offered.  Safety maintained with q15 minute checks.

## 2015-05-25 NOTE — Progress Notes (Signed)
  [facility] :  Will you be returning to the same living situation after discharge:  Yes,  with family At discharge, do you have transportation home?: Yes,  mother Do you have the ability to pay for your medications: Yes,  through [employer]  Release of information consent forms completed and in the chart;  Patient's signature needed at discharge.  Patient to Follow up at: Follow-up Information                   Follow up with [facility]    . Call today.   Why:  For evaluation of elevated blood pressure which may require management with medication    Contact information:   [address] [place] [phone]      [facility] Go on 05/28/2015.   Why:  FOLLOWUP APPOINTMENT scheculed for Monday July 11 at 11 AM   Contact information:   [address] [place] [id number] [facility] [phone] Valinda HoarFAX [phone]      Patient denies SI/HI: Yes,  denies both    Safety Planning and Suicide Prevention discussed: Yes,  with patient  Have you used any form of tobacco in the last 30 days? (Cigarettes, Smokeless Tobacco, Cigars, and/or Pipes): No  Has patient been referred to the Quitline?: N/A patient is not a smoker  Clide DalesHarrill, Miquan Tandon Campbell 05/25/2015, 1:10 PM
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
## 2015-05-25 NOTE — [facility] Suicide Risk Assessment (Signed)
[facility] INPATIENT:  Family/Significant Other Suicide Prevention Education  Suicide Prevention Education:  Patient Refusal for Family/Significant Other Suicide Prevention Education: The patient Nicholas [name] has refused to provide written consent for family/significant other to be provided Family/Significant Other Suicide Prevention Education during admission and/or prior to discharge.  Physician notified. Writer provided suicide prevention education directly to patient; conversation included risk factors, warning signs and resources to contact for help. Mobile crisis services explained and  explanations given as to resources which will be included on patient's discharge paperwork.   Clide DalesHarrill, Lucille Witts Campbell 05/25/2015, 3:49 PM

## 2015-05-25 NOTE — Progress Notes (Signed)
Discharge- Patient verbalizes readiness for discharge: Denies SI/HI. A- Discharge instructions read and discussed with patient.  All belongings returned to patient. R- Patient was cooperative during the discharge process.  Patient verbalizes understanding of discharge instructions.  Signed for return of belongings. Escorted to the lobby and left with his mother and another guest.

## 2015-05-25 NOTE — Tx Team (Signed)
Interdisciplinary Treatment Plan Update (Adult)  Date:  05/25/2015 Time Reviewed:  8:57 AM  Progress in Treatment: Attending groups: Yes. Participating in groups:  Yes. Taking medication as prescribed:  Yes. Tolerating medication:  Yes. Family/Significant othe contact made:  No, will contact:  collateral w patient permission Patient understands diagnosis:  Yes. Discussing patient identified problems/goals with staff:  Yes. Medical problems stabilized or resolved:  Yes. Denies suicidal/homicidal ideation: Yes. Issues/concerns per patient self-inventory:  No. Other:  New problem(s) identified: Yes, Describe:  withdrawal management  Discharge Plan or Barriers: none  Reason for Continuation of Hospitalization: Depression Suicidal ideation Withdrawal symptoms  Comments: 05/22/15:  Patient continues to progress through withdrawal, CIWA and COWS reducing, some unstable vital signs, aftercare follow up appt made w current provider.    05/25/15: Patient stable for discharge, [facility] [place] [facility].  Return to prior living arrangement.   Estimated length of stay: 3 - 5 days  New goal(s): continued management of withdrawal and medication stabilization  Review of initial/current patient goals per problem list:  See initial plan of care   Attendees: Patient:   7/8/20168:57 AM  Family:   7/8/20168:57 AM  Physician:  Jola BaptistI Lugo MD 7/8/20168:57 AM  Nursing:   Sue LushAndrea RN 7/8/20168:57 AM  Case Manager:  Santa GeneraAnne Cunningham LCSW 7/8/20168:57 AM  Counselor:   7/8/20168:57 AM  Other:  Sondra BargesJ CLark, RN UR 7/8/20168:57 AM  Other:  Tedra CoupeV Enoch, [facility] 7/8/20168:57 AM  Other:   7/8/20168:57 AM  Other:  7/8/20168:57 AM  Other:  7/8/20168:57 AM  Other:  7/8/20168:57 AM  Other:  7/8/20168:57 AM  Other:  7/8/20168:57 AM  Other:  7/8/20168:57 AM  Other:   7/8/20168:57 AM   Scribe for Treatment Team:   Sallee Langeunningham, Anne C, 05/25/2015, 8:57 AM

## 2015-07-11 ENCOUNTER — Emergency Department (HOSPITAL_COMMUNITY)
Admission: EM | Admit: 2015-07-11 | Discharge: 2015-07-13 | Disposition: A | Discharge status: Other Acute Inpatient Hospital | Payer: Self-pay | Attending: Emergency Medicine | Admitting: Emergency Medicine

## 2015-07-11 DIAGNOSIS — Z046 Encounter for general psychiatric examination, requested by authority: Secondary | ICD-10-CM

## 2015-07-11 DIAGNOSIS — R000 Tachycardia, unspecified: Secondary | ICD-10-CM | POA: Insufficient documentation

## 2015-07-11 DIAGNOSIS — F1110 Opioid abuse, uncomplicated: Secondary | ICD-10-CM | POA: Insufficient documentation

## 2015-07-11 DIAGNOSIS — F339 Major depressive disorder, recurrent, unspecified: Secondary | ICD-10-CM | POA: Diagnosis present

## 2015-07-11 DIAGNOSIS — R45850 Homicidal ideations: Secondary | ICD-10-CM

## 2015-07-11 DIAGNOSIS — F1910 Other psychoactive substance abuse, uncomplicated: Secondary | ICD-10-CM

## 2015-07-11 DIAGNOSIS — T189XXA Foreign body of alimentary tract, part unspecified, initial encounter: Secondary | ICD-10-CM

## 2015-07-11 DIAGNOSIS — F1410 Cocaine abuse, uncomplicated: Secondary | ICD-10-CM | POA: Insufficient documentation

## 2015-07-11 DIAGNOSIS — Z8679 Personal history of other diseases of the circulatory system: Secondary | ICD-10-CM | POA: Insufficient documentation

## 2015-07-11 DIAGNOSIS — F1210 Cannabis abuse, uncomplicated: Secondary | ICD-10-CM | POA: Insufficient documentation

## 2015-07-11 DIAGNOSIS — F1310 Sedative, hypnotic or anxiolytic abuse, uncomplicated: Secondary | ICD-10-CM | POA: Insufficient documentation
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
## 2015-07-12 ENCOUNTER — Encounter (HOSPITAL_COMMUNITY): Payer: Self-pay | Admitting: Emergency Medicine

## 2015-07-12 ENCOUNTER — Emergency Department (HOSPITAL_COMMUNITY): Payer: Self-pay

## 2015-07-12 DIAGNOSIS — F339 Major depressive disorder, recurrent, unspecified: Secondary | ICD-10-CM | POA: Diagnosis present

## 2015-07-12 LAB — CBC WITH DIFFERENTIAL/PLATELET
BASOS ABS: 0.1 10*3/uL (ref 0.0–0.1)
BASOS PCT: 1 % (ref 0–1)
EOS ABS: 0.5 10*3/uL (ref 0.0–0.7)
EOS PCT: 4 % (ref 0–5)
HCT: 40.2 % (ref 39.0–52.0)
Hemoglobin: 13.4 g/dL (ref 13.0–17.0)
LYMPHS ABS: 2.9 10*3/uL (ref 0.7–4.0)
Lymphocytes Relative: 22 % (ref 12–46)
MCH: 32.1 pg (ref 26.0–34.0)
MCHC: 33.3 g/dL (ref 30.0–36.0)
MCV: 96.2 fL (ref 78.0–100.0)
Monocytes Absolute: 0.8 10*3/uL (ref 0.1–1.0)
Monocytes Relative: 6 % (ref 3–12)
Neutro Abs: 8.9 10*3/uL — ABNORMAL HIGH (ref 1.7–7.7)
Neutrophils Relative %: 67 % (ref 43–77)
PLATELETS: 230 10*3/uL (ref 150–400)
RBC: 4.18 MIL/uL — AB (ref 4.22–5.81)
RDW: 12.9 % (ref 11.5–15.5)
WBC: 13.1 10*3/uL — AB (ref 4.0–10.5)

## 2015-07-12 LAB — COMPREHENSIVE METABOLIC PANEL
ALBUMIN: 4.6 g/dL (ref 3.5–5.0)
ALT: 55 U/L (ref 17–63)
AST: 61 U/L — ABNORMAL HIGH (ref 15–41)
Alkaline Phosphatase: 107 U/L (ref 38–126)
Anion gap: 9 (ref 5–15)
BUN: 13 mg/dL (ref 6–20)
CHLORIDE: 102 mmol/L (ref 101–111)
CO2: 29 mmol/L (ref 22–32)
CREATININE: 0.92 mg/dL (ref 0.61–1.24)
Calcium: 9.4 mg/dL (ref 8.9–10.3)
GFR calc non Af Amer: >60 mL/min (ref >60)
Glucose, Bld: 116 mg/dL — ABNORMAL HIGH (ref 65–99)
Potassium: 3.4 mmol/L — ABNORMAL LOW (ref 3.5–5.1)
SODIUM: 140 mmol/L (ref 135–145)
Total Bilirubin: 0.5 mg/dL (ref 0.3–1.2)
Total Protein: 7.9 g/dL (ref 6.5–8.1)

## 2015-07-12 LAB — RAPID URINE DRUG SCREEN, HOSP PERFORMED
Amphetamines: NOT DETECTED
BARBITURATES: NOT DETECTED
BENZODIAZEPINES: POSITIVE — AB
COCAINE: POSITIVE — AB
Opiates: POSITIVE — AB
TETRAHYDROCANNABINOL: POSITIVE — AB

## 2015-07-12 LAB — ETHANOL: Alcohol, Ethyl (B): <5 mg/dL (ref <5)

## 2015-07-12 MED ORDER — ROPINIROLE HCL 0.25 MG PO TABS
0.2500 mg | ORAL_TABLET | Freq: Every day | ORAL | Status: DC
  Administered 2015-07-12: 0.25 mg via ORAL
  Filled 2015-07-12 (×2): qty 1

## 2015-07-12 MED ORDER — QUETIAPINE FUMARATE 100 MG PO TABS
100.0000 mg | ORAL_TABLET | Freq: Every day | ORAL | Status: DC
  Administered 2015-07-12 (×2): 100 mg via ORAL
  Filled 2015-07-12 (×2): qty 1

## 2015-07-12 MED ORDER — ACETAMINOPHEN 325 MG PO TABS: 650.0000 mg | ORAL_TABLET | Freq: Four times a day (QID) | ORAL | Status: DC | PRN

## 2015-07-12 MED ORDER — STERILE WATER FOR INJECTION IJ SOLN
INTRAMUSCULAR | Status: AC
  Filled 2015-07-12: qty 10

## 2015-07-12 MED ORDER — CHLORDIAZEPOXIDE HCL 25 MG PO CAPS: 25.0000 mg | ORAL_CAPSULE | Freq: Four times a day (QID) | ORAL | Status: DC | PRN

## 2015-07-12 MED ORDER — HYDROXYZINE HCL 25 MG PO TABS: 25.0000 mg | ORAL_TABLET | Freq: Four times a day (QID) | ORAL | Status: DC | PRN

## 2015-07-12 MED ORDER — NAPROXEN 250 MG PO TABS: 500.0000 mg | ORAL_TABLET | Freq: Two times a day (BID) | ORAL | Status: DC | PRN

## 2015-07-12 MED ORDER — LORAZEPAM 2 MG/ML IJ SOLN
2.0000 mg | Freq: Once | INTRAMUSCULAR | Status: AC
  Administered 2015-07-12: 2 mg via INTRAMUSCULAR
  Filled 2015-07-12: qty 1

## 2015-07-12 MED ORDER — ZIPRASIDONE MESYLATE 20 MG IM SOLR
10.0000 mg | Freq: Once | INTRAMUSCULAR | Status: AC
  Administered 2015-07-12: 10 mg via INTRAMUSCULAR

## 2015-07-12 MED ORDER — ZIPRASIDONE MESYLATE 20 MG IM SOLR
INTRAMUSCULAR | Status: AC
  Filled 2015-07-12: qty 20

## 2015-07-12 MED ORDER — CHLORDIAZEPOXIDE HCL 25 MG PO CAPS
25.0000 mg | ORAL_CAPSULE | ORAL | Status: DC
Start: 2015-07-14 — End: 2015-07-12

## 2015-07-12 MED ORDER — CHLORDIAZEPOXIDE HCL 25 MG PO CAPS: 25.0000 mg | ORAL_CAPSULE | Freq: Four times a day (QID) | ORAL | Status: DC

## 2015-07-12 MED ORDER — THIAMINE HCL 100 MG/ML IJ SOLN: 100.0000 mg | Freq: Once | INTRAMUSCULAR | Status: DC

## 2015-07-12 MED ORDER — METHOCARBAMOL 500 MG PO TABS: 500.0000 mg | ORAL_TABLET | Freq: Three times a day (TID) | ORAL | Status: DC | PRN

## 2015-07-12 MED ORDER — ALUM & MAG HYDROXIDE-SIMETH 200-200-20 MG/5ML PO SUSP: 30.0000 mL | ORAL | Status: DC | PRN

## 2015-07-12 MED ORDER — STERILE WATER FOR INJECTION IJ SOLN
INTRAMUSCULAR | Status: AC
  Administered 2015-07-12: 04:00:00
  Filled 2015-07-12: qty 10

## 2015-07-12 MED ORDER — ONDANSETRON 4 MG PO TBDP: 4.0000 mg | ORAL_TABLET | Freq: Four times a day (QID) | ORAL | Status: DC | PRN

## 2015-07-12 MED ORDER — CHLORDIAZEPOXIDE HCL 25 MG PO CAPS: 25.0000 mg | ORAL_CAPSULE | Freq: Every day | ORAL | Status: DC

## 2015-07-12 MED ORDER — LOPERAMIDE HCL 2 MG PO CAPS: 2.0000 mg | ORAL_CAPSULE | ORAL | Status: DC | PRN

## 2015-07-12 MED ORDER — ROPINIROLE HCL 0.25 MG PO TABS
0.5000 mg | ORAL_TABLET | Freq: Every day | ORAL | Status: DC
  Administered 2015-07-12: 0.5 mg via ORAL
  Filled 2015-07-12 (×2): qty 1

## 2015-07-12 MED ORDER — ZIPRASIDONE MESYLATE 20 MG IM SOLR
10.0000 mg | Freq: Once | INTRAMUSCULAR | Status: AC
  Administered 2015-07-12: 10 mg via INTRAMUSCULAR
  Filled 2015-07-12: qty 20

## 2015-07-12 MED ORDER — MAGNESIUM HYDROXIDE 400 MG/5ML PO SUSP: 30.0000 mL | Freq: Every day | ORAL | Status: DC | PRN

## 2015-07-12 MED ORDER — ADULT MULTIVITAMIN W/MINERALS CH: 1.0000 | ORAL_TABLET | Freq: Every day | ORAL | Status: DC

## 2015-07-12 MED ORDER — CHLORDIAZEPOXIDE HCL 25 MG PO CAPS: 25.0000 mg | ORAL_CAPSULE | Freq: Three times a day (TID) | ORAL | Status: DC

## 2015-07-12 MED ORDER — POTASSIUM CHLORIDE CRYS ER 20 MEQ PO TBCR: 40.0000 meq | EXTENDED_RELEASE_TABLET | Freq: Once | ORAL | Status: DC

## 2015-07-12 MED ORDER — ROPINIROLE HCL 0.25 MG PO TABS
ORAL_TABLET | ORAL | Status: AC
Start: 2015-07-12 — End: 2015-07-12
  Filled 2015-07-12: qty 1

## 2015-07-12 NOTE — ED Notes (Signed)
Patient is refusing to take x-ray, denies that he swallowed safety pen, informed April Rickman to bring patient back to ED, and we will try later to x-ray patient.

## 2015-07-12 NOTE — Progress Notes (Signed)
Spoke with [facility] care coordinator Lilyan Punt, who states she is making a care coordination referral as pt has now 2 psychiatric crisis events within the past 60 days (first being admission to [facility] 05/2015). She requests she be updated regarding pt's placement so care coordination can follow his progress. [phone]  Ilean Skill, MSW, LCSW Clinical Social Work, Disposition  07/12/2015 [phone]

## 2015-07-12 NOTE — ED Notes (Signed)
Patient resting. Eyes closed. [facility] at bedside.

## 2015-07-12 NOTE — [facility] Assessment (Signed)
[facility] Assessment Progress Note   This clinician just coming on shift and informed of this consult.  Called and scheduled pt's tele assessment as well as gathered clinical information from EDP Wars.  Casimer Lanius, MS, [facility] Therapeutic Triage Specialist [facility]

## 2015-07-12 NOTE — ED Notes (Signed)
Patient resting, [facility] at bedside. No distress. Awaiting placement.
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
## 2015-07-12 NOTE — ED Notes (Signed)
[employer] at bedside.  

## 2015-07-12 NOTE — ED Notes (Signed)
Patient is asking for oxycodone and then wants detox medications, states "he is hurting all over and wants something for the pain", informed EDP Zammit, who offer patient  60 mg of Tordal IM, patient refused stating "hell no".

## 2015-07-12 NOTE — ED Notes (Signed)
Patient requesting oxycodone. States he takes this for chronic pain and it is filled every month at [facility] in [place]. [facility] contacted by this RN. Patient only has Seroquel Rx filled at their store. MD Rubin Payor made aware.  Patient offered Tylenol or ibuprofen for pain and states "only if it tylenol 3"

## 2015-07-12 NOTE — ED Notes (Signed)
Patient resting in bed. Eyes closed, respirations even and unlabored. Vital signs obtained and stable. Patient cooperative with vitals. [facility] at bedside. Patient with right hand cuffed to stretcher.
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
## 2015-07-12 NOTE — ED Notes (Signed)
telepsych in progress 

## 2015-07-12 NOTE — ED Notes (Signed)
Patient now stating that he goes to a pain clinic in [place] on [address]. Behavioral Health unable to confirm this. No notes in chart. Requesting medication for "suboxone, methadone, something". Explained to patient those medications are not available here and would not be given. Patient stating he cannot stay here one more night without those medications. Informed patient he is pending psychiatric admission and would not leave our facility until he had a place to go for admission.

## 2015-07-12 NOTE — ED Notes (Signed)
Patient allowed to use the phone. This is is third phone call of the day.

## 2015-07-12 NOTE — ED Provider Notes (Signed)
  Physical Exam  BP 113/68 mmHg  Pulse 85  Temp(Src) 98.8 F (37.1 C)  Resp 12  Ht  (2.007 m)  Wt 180 lb (81.647 kg)  BMI 20.27 kg/m2  SpO2 100%  Physical Exam  ED Course  Procedures  MDM TTS has seen and recommended inpatient treatment.      Benjiman Core, MD 07/12/15 [phone]

## 2015-07-12 NOTE — ED Notes (Signed)
Patient update with pending psych admission status. Patient unhappy, stating he doesn't want to go. Patient reminded of IVC paperwork and that admission is recommended by psychiatry and I would update him when a bed is available. Lunch tray given. [facility] remains at bedside.

## 2015-07-12 NOTE — ED Notes (Signed)
Patient yelling and cursing in hallway to use phone. Explained to patient that he will not be allowed to use phone until all assessments and tests have been completed. Explained to patient his IVC status and that [facility] will be with him during his stay. Patient states understanding and agrees to change into paper scrubs and provide urine sample.

## 2015-07-12 NOTE — Progress Notes (Addendum)
Seeking inpatient psych placement. Also considered for [facility] admission upon bed availability  Referred to: [facility]- on waiting list [place]- per Albin Felling; message left [facility]- per Jerilynn Som, will review- bed availability pending  [facility]- declined due to behaviors  At capacity currently: [facility] Joellyn Quails, MSW, [facility] Clinical Social Work, Disposition  07/12/2015 [phone]

## 2015-07-12 NOTE — ED Notes (Signed)
Patient given breakfast tray.

## 2015-07-12 NOTE — ED Notes (Signed)
Patient informed me that he ingested an open safety. Informed doctor.

## 2015-07-12 NOTE — ED Notes (Signed)
Patient came out of restroom, refused to change into paper scrubs. [employer] at bedside.  Patient states "whats your last name, whats your last name" "its ok, I know where you work. Ill find you"
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
## 2015-07-12 NOTE — ED Notes (Signed)
Patient refusing to change into paper scrubs, refusing lab tests, and refusing to give information to psych assessment. Patient endorses HI "to anyone who wont give him his medicine" [facility] at bedside with patient at this time. Patient has metal handcuff to bilateral wrists

## 2015-07-12 NOTE — ED Notes (Signed)
Patient was given portable x-ray, stated he was hungry, I gave patient a dinner tray and drink.

## 2015-07-13 ENCOUNTER — Encounter (HOSPITAL_COMMUNITY): Payer: Self-pay | Admitting: *Deleted

## 2015-07-13 ENCOUNTER — Inpatient Hospital Stay (HOSPITAL_COMMUNITY)
Admission: AD | Admit: 2015-07-13 | Discharge: 2015-07-16 | DRG: 885 | Disposition: A | Discharge status: Home / Self Care | Payer: No Typology Code available for payment source | Source: Intra-hospital | Attending: Psychiatry | Admitting: Psychiatry

## 2015-07-13 DIAGNOSIS — F1920 Other psychoactive substance dependence, uncomplicated: Secondary | ICD-10-CM

## 2015-07-13 DIAGNOSIS — F333 Major depressive disorder, recurrent, severe with psychotic symptoms: Secondary | ICD-10-CM | POA: Diagnosis present

## 2015-07-13 DIAGNOSIS — G8929 Other chronic pain: Secondary | ICD-10-CM | POA: Diagnosis present

## 2015-07-13 DIAGNOSIS — G2581 Restless legs syndrome: Secondary | ICD-10-CM | POA: Diagnosis present

## 2015-07-13 DIAGNOSIS — F1120 Opioid dependence, uncomplicated: Secondary | ICD-10-CM | POA: Diagnosis present

## 2015-07-13 DIAGNOSIS — F339 Major depressive disorder, recurrent, unspecified: Secondary | ICD-10-CM | POA: Diagnosis present

## 2015-07-13 DIAGNOSIS — F1994 Other psychoactive substance use, unspecified with psychoactive substance-induced mood disorder: Secondary | ICD-10-CM | POA: Diagnosis not present

## 2015-07-13 DIAGNOSIS — F410 Panic disorder [episodic paroxysmal anxiety] without agoraphobia: Secondary | ICD-10-CM | POA: Diagnosis not present

## 2015-07-13 DIAGNOSIS — F1020 Alcohol dependence, uncomplicated: Secondary | ICD-10-CM | POA: Diagnosis present

## 2015-07-13 MED ORDER — ACETAMINOPHEN 325 MG PO TABS
650.0000 mg | ORAL_TABLET | Freq: Four times a day (QID) | ORAL | Status: DC | PRN
  Administered 2015-07-14: 650 mg via ORAL
  Administered 2015-07-15: 325 mg via ORAL
  Administered 2015-07-16: 650 mg via ORAL
  Filled 2015-07-13: qty 2
  Filled 2015-07-13: qty 1
  Filled 2015-07-13: qty 2

## 2015-07-13 MED ORDER — LORAZEPAM 1 MG PO TABS
1.0000 mg | ORAL_TABLET | Freq: Every day | ORAL | Status: DC
Start: 2015-07-16 — End: 2015-07-13

## 2015-07-13 MED ORDER — ADULT MULTIVITAMIN W/MINERALS CH
1.0000 | ORAL_TABLET | Freq: Every day | ORAL | Status: DC
  Administered 2015-07-14 – 2015-07-16 (×3): 1 via ORAL
  Filled 2015-07-13 (×7): qty 1

## 2015-07-13 MED ORDER — LORAZEPAM 1 MG PO TABS: 1.0000 mg | ORAL_TABLET | Freq: Three times a day (TID) | ORAL | Status: DC

## 2015-07-13 MED ORDER — ROPINIROLE HCL 0.25 MG PO TABS
0.2500 mg | ORAL_TABLET | Freq: Every day | ORAL | Status: DC
  Administered 2015-07-13 – 2015-07-15 (×3): 0.25 mg via ORAL
  Filled 2015-07-13 (×5): qty 1

## 2015-07-13 MED ORDER — LORAZEPAM 1 MG PO TABS: 1.0000 mg | ORAL_TABLET | Freq: Four times a day (QID) | ORAL | Status: DC

## 2015-07-13 MED ORDER — ALPRAZOLAM 0.5 MG PO TABS
0.5000 mg | ORAL_TABLET | Freq: Three times a day (TID) | ORAL | Status: DC | PRN
Start: 2015-07-13 — End: 2015-07-16
  Administered 2015-07-13 – 2015-07-16 (×8): 0.5 mg via ORAL
  Filled 2015-07-13 (×10): qty 1

## 2015-07-13 MED ORDER — LORAZEPAM 1 MG PO TABS: 1.0000 mg | ORAL_TABLET | ORAL | Status: DC | PRN

## 2015-07-13 MED ORDER — OLANZAPINE 5 MG PO TBDP: 5.0000 mg | ORAL_TABLET | Freq: Four times a day (QID) | ORAL | Status: DC | PRN

## 2015-07-13 MED ORDER — OLANZAPINE 5 MG PO TBDP: 5.0000 mg | ORAL_TABLET | Freq: Three times a day (TID) | ORAL | Status: DC | PRN

## 2015-07-13 MED ORDER — HYDROXYZINE HCL 25 MG PO TABS
25.0000 mg | ORAL_TABLET | Freq: Four times a day (QID) | ORAL | Status: AC | PRN
  Administered 2015-07-13: 25 mg via ORAL
  Filled 2015-07-13: qty 1

## 2015-07-13 MED ORDER — LOPERAMIDE HCL 2 MG PO CAPS: 2.0000 mg | ORAL_CAPSULE | ORAL | Status: AC | PRN

## 2015-07-13 MED ORDER — ZIPRASIDONE MESYLATE 20 MG IM SOLR: 20.0000 mg | INTRAMUSCULAR | Status: DC | PRN

## 2015-07-13 MED ORDER — ALPRAZOLAM 1 MG PO TABS
1.0000 mg | ORAL_TABLET | Freq: Every evening | ORAL | Status: DC | PRN
  Administered 2015-07-13 – 2015-07-15 (×3): 1 mg via ORAL
  Filled 2015-07-13 (×2): qty 1

## 2015-07-13 MED ORDER — QUETIAPINE FUMARATE 100 MG PO TABS
100.0000 mg | ORAL_TABLET | Freq: Every day | ORAL | Status: DC
  Administered 2015-07-13 – 2015-07-14 (×2): 100 mg via ORAL
  Filled 2015-07-13 (×6): qty 1

## 2015-07-13 MED ORDER — LORAZEPAM 1 MG PO TABS: 1.0000 mg | ORAL_TABLET | Freq: Two times a day (BID) | ORAL | Status: DC

## 2015-07-13 MED ORDER — QUETIAPINE FUMARATE 100 MG PO TABS
100.0000 mg | ORAL_TABLET | Freq: Every day | ORAL | Status: DC
  Filled 2015-07-13: qty 1

## 2015-07-13 MED ORDER — MAGNESIUM HYDROXIDE 400 MG/5ML PO SUSP: 30.0000 mL | Freq: Every day | ORAL | Status: DC | PRN

## 2015-07-13 MED ORDER — LORAZEPAM 1 MG PO TABS: 1.0000 mg | ORAL_TABLET | Freq: Four times a day (QID) | ORAL | Status: DC | PRN

## 2015-07-13 MED ORDER — QUETIAPINE FUMARATE 100 MG PO TABS
100.0000 mg | ORAL_TABLET | Freq: Once | ORAL | Status: AC
  Administered 2015-07-14: 100 mg via ORAL
  Filled 2015-07-13: qty 1

## 2015-07-13 MED ORDER — OXYCODONE HCL 5 MG PO TABS
10.0000 mg | ORAL_TABLET | Freq: Three times a day (TID) | ORAL | Status: DC | PRN
Start: 2015-07-13 — End: 2015-07-16
  Administered 2015-07-13 – 2015-07-16 (×6): 10 mg via ORAL
  Filled 2015-07-13 (×7): qty 2

## 2015-07-13 MED ORDER — ONDANSETRON 4 MG PO TBDP
4.0000 mg | ORAL_TABLET | Freq: Four times a day (QID) | ORAL | Status: AC | PRN
  Administered 2015-07-14 – 2015-07-15 (×3): 4 mg via ORAL
  Filled 2015-07-13 (×3): qty 1

## 2015-07-13 MED ORDER — ALUM & MAG HYDROXIDE-SIMETH 200-200-20 MG/5ML PO SUSP: 30.0000 mL | ORAL | Status: DC | PRN
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
## 2015-07-13 NOTE — ED Notes (Signed)
Patient ask to take a shower, when security was called, patient decided to change his mind and stated" my restless leg syndrome is bothering me, I just going to take a nap".

## 2015-07-13 NOTE — Clinical Social Work Note (Signed)
Pt extremely agitated this afternoon. Refusing to discuss aftercare at this time or sign consents. Pt demanding to discharge by 5pm today. Per Dr. Dub Mikes, this will not be happening. Pt has been following up at [facility] in the past. CSW will reassess on Monday.  Trula Slade, LCSWA Clinical Social Worker 07/13/2015 3:31 PM
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
## 2015-07-13 NOTE — Tx Team (Signed)
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       Pt stated,":I need to make some money. I live with my mom."                                           DISCHARGE CRITERIA:  Ability to meet basic life and health needs Motivation to continue treatment in a less acute level of care Safe-care adequate arrangements made  PRELIMINARY DISCHARGE PLAN: Outpatient therapy Participate in family therapy Return to previous living arrangement  PATIENT/FAMIILY INVOLVEMENT: This treatment plan has been presented to and reviewed   The patient has been given the opportunity to ask questions and make suggestions.  Rodman Key [facility] 07/13/2015, 1:38 PM

## 2015-07-13 NOTE — Progress Notes (Signed)
Pt attended AA group this evening.  

## 2015-07-13 NOTE — Progress Notes (Signed)
Per Inetta Fermo, [facility] bed 302-2 by Dr. Dub Mikes. Pt under IVC. Can be transported when ready.  Spoke with [facility] re: pt's placement.  Ilean Skill, MSW, LCSW Clinical Social Work, Disposition  07/13/2015 [phone]

## 2015-07-13 NOTE — ED Notes (Signed)
Patient was taken to shower room for a shower, accompanied by [facility] security and [facility].
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
## 2015-07-14 ENCOUNTER — Encounter (HOSPITAL_COMMUNITY): Payer: Self-pay | Admitting: Psychiatry

## 2015-07-14 DIAGNOSIS — F1920 Other psychoactive substance dependence, uncomplicated: Secondary | ICD-10-CM

## 2015-07-14 DIAGNOSIS — F410 Panic disorder [episodic paroxysmal anxiety] without agoraphobia: Secondary | ICD-10-CM

## 2015-07-14 NOTE — Progress Notes (Signed)
D.  Pt pleasant on approach, no complaints voiced.  Pt has been visible in milieu, positive for evening wrap up group.  Interacting appropriately with peers on the unit at this time.  Pt denies SI/HI/hallucinations at this time.  A.  Support and encouragement offered, spoke to Dr. Dub Mikes about continuing no roommate order due to lability and threats of homicide when admitted.  Pt will be re-evaluated tomorrow by MD.  R.  Pt remains safe on unit, will continue to monitor.

## 2015-07-14 NOTE — Progress Notes (Signed)
Pt with complaint of racing thoughts, unable to sleep.  Gave Pt xanax 0.5 as ordered as well as alerted NP who ordered one time extra dose of Seroquel 100 mg.  Will continue to monitor.

## 2015-07-14 NOTE — [facility] Group Notes (Signed)
[facility] Group Notes:  (Clinical Social Work)  07/14/2015     10-11AM  Summary of Progress/Problems:   The main focus of today's process group was to learn how to use a decisional balance exercise to move forward in the Stages of Change, which were described and discussed.  Patients listed needs on the whiteboard and unhealthy coping techniques often used to fill needs.  Motivational Interviewing and the whiteboard were utilized to help patients explore in depth the perceived benefits and costs of unhealthy coping techniques, as well as the  benefits and costs of replacing that with a healthy coping skills.  A handout was distributed for patients to be able to do this exercise for themselves.   The patient expressed that his own unhealthy coping involves manipulation, doctor shopping and drugs.  He did not speak often, but listened carefully and did speak in support of others at times and to share his ideas.  Type of Therapy:  Group Therapy - Process   Participation Level:  Active  Participation Quality:  Appropriate, Attentive, Sharing and Supportive  Affect:  Blunted and Depressed  Cognitive:  Alert, Appropriate and Oriented  Insight:  Engaged  Engagement in Therapy:  Engaged  Modes of Intervention:  Education, Motivational Interviewing  Ambrose Mantle, LCSW 07/14/2015, 12:32 PM

## 2015-07-14 NOTE — Progress Notes (Signed)
Patient did attend the evening speaker AA meeting.  

## 2015-07-14 NOTE — [facility] Suicide Risk Assessment (Signed)
[facility] Admission Suicide Risk Assessment   Nursing information obtained from:  Patient Demographic factors:  Male, Caucasian Current Mental Status:  Self-harm behaviors Loss Factors:    Historical Factors:  Family history of mental illness or substance abuse Risk Reduction Factors:  Living with another person, especially a relative Total Time spent with patient: 45 minutes Principal Problem: <principal problem not specified> Diagnosis:   Patient Active Problem List   Diagnosis Date Noted  . Alcohol use disorder, severe, dependence [F10.20] 07/13/2015  . Depression, recurrent [F33.9] 07/12/2015  . Opioid dependence with withdrawal [F11.23]   . Polysubstance dependence including opioid type drug, continuous use, with perceptual disturbance [F11.222]   . Suicide attempt by drug ingestion [T50.902A]   . Polysubstance dependence including opioid type drug without complication, episodic abuse [F11.220] 01/08/2015  . Polysubstance dependence including opioid type drug, continuous use [F11.229] 03/18/2014  . Substance induced mood disorder [F19.94] 03/18/2014  . Opiate dependence [F11.20] 03/17/2014  . Benzodiazepine dependence [F13.20] 03/17/2014     Continued Clinical Symptoms:  Alcohol Use Disorder Identification Test Final Score (AUDIT): 0 The "Alcohol Use Disorders Identification Test", Guidelines for Use in Primary Care, Second Edition.  World Science writer [facility]). Score between 0-7:  no or low risk or alcohol related problems. Score between 8-15:  moderate risk of alcohol related problems. Score between 16-19:  high risk of alcohol related problems. Score 20 or above:  warrants further diagnostic evaluation for alcohol dependence and treatment.   CLINICAL FACTORS:   Alcohol/Substance Abuse/Dependencies  Psychiatric Specialty Exam: Physical Exam  ROS  Blood pressure 143/91, pulse 112, temperature 98.2 F (36.8 C), temperature source Oral, resp. rate 18, height  (1.88  m), weight 104.327 kg (230 lb).Body mass index is 29.52 kg/(m^2).    COGNITIVE FEATURES THAT CONTRIBUTE TO RISK:  Closed-mindedness, Polarized thinking and Thought constriction (tunnel vision)    SUICIDE RISK:   Moderate:  Frequent suicidal ideation with limited intensity, and duration, some specificity in terms of plans, no associated intent, good self-control, limited dysphoria/symptomatology, some risk factors present, and identifiable protective factors, including available and accessible social support.  PLAN OF CARE: Supportive approach/coping skills                              Polysubstance dependence;                              Identify detox needs                              Encourage abstinence/work a relapse prevention plan                              Mood instability; resume the Seroquel 100 mg HS                              Continue the xanax and the roxicodone Medical Decision Making:  Review of Psycho-Social Stressors (1), Review or order clinical lab tests (1) and Review of Medication Regimen & Side Effects (2)  I certify that inpatient services furnished can reasonably be expected to improve the patient's condition.   Shaely Gadberry A 07/14/2015, 5:51 PM

## 2015-07-14 NOTE — Progress Notes (Signed)
D: Patient is calm and cooperative.  Denies visual and auditory hallucinations.  Contracts for safety.  Patient complain of anxiety and pain on his right ankle and back of neck.  Patient requested xanax for anxiety and oxy IR for pain.  Reports good effect from both PRN given. A: Maintained on routine safety checks per protocol.  Attended all group therapy.  Support and encouragement offered. R:  Patient stayed in the dayroom watching TV and socializing with peers.
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
## 2015-07-14 NOTE — Plan of Care (Signed)
Problem: Alteration in mood & ability to function due to Goal: STG-Patient will attend groups Outcome: Progressing Pt attended AA group     

## 2015-07-14 NOTE — [facility] Group Notes (Signed)
[facility] Group Notes:  (Nursing/MHT/Case Management/Adjunct)  Date:  07/14/2015  Time:  0900 Type of Therapy:  Group Therapy  Participation Level:  Active  Participation Quality:  Appropriate  Affect:  Appropriate  Cognitive:  Appropriate  Insight:  Appropriate  Engagement in Group:  Engaged  Modes of Intervention:  Discussion, Education and Support  Summary of Progress/Problems:  Patient was attentive and participated in group.  Patient states long term goal after discharge is to go back to school and complete his college degree.  Mickie Bail 07/14/2015, 11:04 AM

## 2015-07-15 DIAGNOSIS — F1994 Other psychoactive substance use, unspecified with psychoactive substance-induced mood disorder: Secondary | ICD-10-CM

## 2015-07-15 MED ORDER — QUETIAPINE FUMARATE 200 MG PO TABS
200.0000 mg | ORAL_TABLET | Freq: Every day | ORAL | Status: DC
  Administered 2015-07-15: 200 mg via ORAL
  Filled 2015-07-15 (×2): qty 1
  Filled 2015-07-15: qty 7

## 2015-07-15 NOTE — [facility] Group Notes (Signed)
[facility] Group Notes:  (Clinical Social Work)  07/15/2015  10:00-11:00AM  Summary of Progress/Problems:   The main focus of today's process group was to   1)  discuss the importance of adding supports  2)  define health supports versus unhealthy supports  3)  identify the patient's current unhealthy supports and plan how to handle them  4)  Identify the patient's current healthy supports and plan what to add.  An emphasis was placed on using counselor, doctor, therapy groups, 12-step groups, and problem-specific support groups to expand supports.    The patient expressed full comprehension of the concepts presented, and agreed that there is a need to add more supports.  The patient stated he has a friend with whom he has long conversations about drugs, and they do online research about how to do new drugs.  His friend lives in [place] where things are very expensive, so he lets his friend take stuff back there and they split the profits.  The group admonished him strongly to cut that person out of his life or he will end up in prison or dead.  He was receptive to this message, but seemed unconvinced and some group members pointed this out to him.  Type of Therapy:  Process Group with Motivational Interviewing  Participation Level:  Active  Participation Quality:  Attentive and Sharing  Affect:  Flat  Cognitive:  Appropriate and Oriented  Insight:  Developing/Improving  Engagement in Therapy:  Engaged  Modes of Intervention:   Education, Support and Processing, Activity  Ambrose Mantle, LCSW 07/15/2015

## 2015-07-15 NOTE — Progress Notes (Signed)
Pt attended the AA Speaker Group tonight.  

## 2015-07-15 NOTE — [facility] Group Notes (Signed)
[facility] Group Notes:  (Nursing/MHT/Case Management/Adjunct)  Date:  07/15/2015  Time:  1300  Type of Therapy:  Nurse Education  Participation Level:  Minimal  Participation Quality:  Attentive  Affect:  Blunted  Cognitive:  Alert  Insight:  Lacking  Engagement in Group:  Limited  Modes of Intervention:  Education and Support  Summary of Progress/Problems: Patient attended nursing ed group however participation was limited.  Merian Capron [facility] 07/15/2015, 1330

## 2015-07-15 NOTE — Progress Notes (Addendum)
Met with patient 1:1 this morning. Forwards minimal information. Affect blunted, mood ambivalent. Denies pain or problems. Main concern is poor sleep last evening due to racing thoughts. Is hopeful hs seroquel can be increased. Rates his depression a "1.7/10", hopelessness a "2.3/10", and his anxiety at a "6.8/10." Encouraged to speak with Dr. Sabra Heck regarding potential med change. Also encouraged to participate in programming. Offered support, reassurance. Denies SI/HI. Will continue to monitor closely as patient can be labile and disruptive to the milieu. Jamie Kato

## 2015-07-15 NOTE — Progress Notes (Signed)
[facility] MD Progress Note  07/15/2015 6:43 PM Nicholas [name]  MRN:  [id number] Subjective:  [name] states that he cant belief what his mother is saying he did. States he has no recollection of a lot of his behavior. Confronted with the possibility that he could have hurt himself or his mother or some one else while trying to have " a good time." he states he cant promise he is going to abstain. He did not sleep last night. States he had a lot of racing thoughts Principal Problem: Substance induced mood disorder Diagnosis:   Patient Active Problem List   Diagnosis Date Noted  . Depression, recurrent [F33.9] 07/12/2015  . Opioid dependence with withdrawal [F11.23]   . Polysubstance dependence including opioid type drug, continuous use, with perceptual disturbance [F11.222]   . Suicide attempt by drug ingestion [T50.902A]   . Polysubstance dependence including opioid type drug without complication, episodic abuse [F11.220] 01/08/2015  . Polysubstance dependence including opioid type drug, continuous use [F11.229] 03/18/2014  . Substance induced mood disorder [F19.94] 03/18/2014  . Opiate dependence [F11.20] 03/17/2014  . 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 Migraine     Past Surgical History  Procedure Laterality Date  . Appendectomy     Family History: History reviewed. No pertinent family history.   Social History:  History  Alcohol Use No     History  Drug Use  . Yes  . Special: Marijuana, Cocaine, Benzodiazepines, Oxycodone, Hydrocodone    Comment: opiates, pills    Social History   Social History  . Marital Status: Single    Spouse Name: N/A  . Number of Children: N/A  . Years of Education: N/A   Social History Main Topics  . Smoking status: Never Smoker   . Smokeless tobacco: None  . Alcohol Use: No  . Drug Use: Yes    Special: Marijuana, Cocaine,  Benzodiazepines, Oxycodone, Hydrocodone     Comment: opiates, pills  . Sexual Activity: Yes   Other Topics Concern  . None   Social History Narrative   Additional History:    Sleep: Poor  Appetite:  Fair   Assessment:   Musculoskeletal: Strength & Muscle Tone: within normal limits Gait & Station: normal Patient leans: normal   Psychiatric Specialty Exam: Physical Exam  Review of Systems  Constitutional: Negative.   HENT: Negative.   Eyes: Negative.   Respiratory: Negative.   Cardiovascular: Negative.   Gastrointestinal: Negative.   Genitourinary: Negative.   Musculoskeletal: Negative.   Skin: Negative.   Neurological: Negative.   Endo/Heme/Allergies: Negative.   Psychiatric/Behavioral: Positive for depression and substance abuse. The patient is nervous/anxious.     Blood pressure 136/84, pulse 115, temperature 97.8 F (36.6 C), temperature source Oral, resp. rate 16, height  (1.88 m), weight 104.327 kg (230 lb).Body mass index is 29.52 kg/(m^2).  General Appearance: Fairly Groomed  Patent attorney::  Fair  Speech:  Clear and Coherent  Volume:  Normal  Mood:  Anxious  Affect:  anxious worried  Thought Process:  Coherent and Goal Directed  Orientation:  Full (Time, Place, and Person)  Thought Content:  symptoms events worries concerns  Suicidal Thoughts:  No  Homicidal Thoughts:  No  Memory:  Immediate;   Fair Recent;   Fair Remote;   Fair  Judgement:  Fair  Insight:  Shallow  Psychomotor Activity:  Restlessness  Concentration:  Fair  Recall:  Jennelle Human of Knowledge:Fair  Language: Fair  Akathisia:  No  Handed:  Right  AIMS (if indicated):     Assets:  Housing Social Support  ADL's:  Intact  Cognition: WNL  Sleep:  Number of Hours: 4.25     Current Medications: Current Facility-Administered Medications  Medication Dose Route Frequency Provider Last Rate Last Dose  . acetaminophen (TYLENOL) tablet 650 mg  650 mg Oral Q6H PRN Sanjuana Kava,  NP   325 mg at 07/15/15 0946  . ALPRAZolam Prudy Feeler) tablet 0.5 mg  0.5 mg Oral TID PRN Rachael Fee, MD   0.5 mg at 07/15/15 0049  . ALPRAZolam Prudy Feeler) tablet 1 mg  1 mg Oral QHS PRN Rachael Fee, MD   1 mg at 07/14/15 2203  . alum & mag hydroxide-simeth (MAALOX/MYLANTA) 200-200-20 MG/5ML suspension 30 mL  30 mL Oral Q4H PRN Sanjuana Kava, NP      . hydrOXYzine (ATARAX/VISTARIL) tablet 25 mg  25 mg Oral Q6H PRN Rachael Fee, MD   25 mg at 07/13/15 1935  . loperamide (IMODIUM) capsule 2-4 mg  2-4 mg Oral PRN Rachael Fee, MD      . magnesium hydroxide (MILK OF MAGNESIA) suspension 30 mL  30 mL Oral Daily PRN Sanjuana Kava, NP      . multivitamin with minerals tablet 1 tablet  1 tablet Oral Daily Rachael Fee, MD   1 tablet at 07/15/15 0800  . ondansetron (ZOFRAN-ODT) disintegrating tablet 4 mg  4 mg Oral Q6H PRN Rachael Fee, MD   4 mg at 07/15/15 0946  . oxyCODONE (Oxy IR/ROXICODONE) immediate release tablet 10 mg  10 mg Oral TID PRN Rachael Fee, MD   10 mg at 07/15/15 1304  . QUEtiapine (SEROQUEL) tablet 200 mg  200 mg Oral QHS Rachael Fee, MD      . rOPINIRole (REQUIP) tablet 0.25 mg  0.25 mg Oral QHS Rachael Fee, MD   0.25 mg at 07/14/15 2204    Lab Results: No results found for this or any previous visit (from the past 48 hour(s)).  Physical Findings: AIMS:  , ,  ,  ,    CIWA:    COWS:     Treatment Plan Summary: Daily contact with patient to assess and evaluate symptoms and progress in treatment and Medication management Supportive approach/coping skills/relapse prevention Polysubstance dependence; work a relapse prevention plan Mood instability; increase the Seroquel to 200 mg HS CBT/mindfulness  Medical Decision Making:  Review of Psycho-Social Stressors (1) and Review of Medication Regimen & Side Effects (2)     Lavonne Cass A 07/15/2015, 6:43 PM

## 2015-07-15 NOTE — Plan of Care (Signed)
Problem: Ineffective individual coping Goal: STG: Patient will remain free from self harm Outcome: Progressing Patient has not engaged in self harm and denies SI  Problem: Alteration in mood & ability to function due to Goal: STG-Patient will comply with prescribed medication regimen (Patient will comply with prescribed medication regimen)  Outcome: Progressing Patient med compliant

## 2015-07-16 MED ORDER — ROPINIROLE HCL 0.25 MG PO TABS: 0.2500 mg | ORAL_TABLET | Freq: Every day | ORAL | Status: AC

## 2015-07-16 MED ORDER — QUETIAPINE FUMARATE 200 MG PO TABS: 200.0000 mg | ORAL_TABLET | Freq: Every day | ORAL | Status: AC

## 2015-07-16 MED ORDER — ALPRAZOLAM 1 MG PO TABS: 1.0000 mg | ORAL_TABLET | Freq: Every evening | ORAL | Status: AC | PRN

## 2015-07-16 MED ORDER — OXYCODONE HCL 10 MG PO TABS: 10.0000 mg | ORAL_TABLET | Freq: Three times a day (TID) | ORAL | Status: AC | PRN

## 2015-07-16 MED ORDER — ALPRAZOLAM 0.5 MG PO TABS: 0.5000 mg | ORAL_TABLET | Freq: Three times a day (TID) | ORAL | Status: AC | PRN

## 2015-07-16 NOTE — Progress Notes (Signed)
  [facility] Adult Case Management Discharge Plan :  Will you be returning to the same living situation after discharge:  Yes,  return home At discharge, do you have transportation home?: Yes,  pt's cousin will pick him up this afternoon Do you have the ability to pay for your medications: Yes,  mental health  Release of information consent forms completed and submitted to medical records by CSW.   Patient to Follow up at: Follow-up Information    Follow up with Arna Medici On 07/18/2015.   Why:  Appt on this date at 10:00AM for hospital follow-up/medication management.    Contact information:   [address] [place], [place] [address] Phone: [phone] Fax: [phone]      Patient denies SI/HI: Yes,  during group/self report.     Safety Planning and Suicide Prevention discussed: Yes,  SPE completed with pt, as he refused to consent to family contact. SPI pamphlet provided to pt and he was encouraged to share information with support network, ask questions, and talk about any concerns relating to SPE.  Have you used any form of tobacco in the last 30 days? (Cigarettes, Smokeless Tobacco, Cigars, and/or Pipes): No  Has patient been referred to the Quitline?: N/A patient is not a smoker  Smart, [place] LCSWA  07/16/2015, 10:06 AM

## 2015-07-16 NOTE — [facility] Suicide Risk Assessment (Signed)
[facility] INPATIENT:  Family/Significant Other Suicide Prevention Education  Suicide Prevention Education:  Patient Refusal for Family/Significant Other Suicide Prevention Education: The patient Nicholas [name] has refused to provide written consent for family/significant other to be provided Family/Significant Other Suicide Prevention Education during admission and/or prior to discharge.  Physician notified.  SPE completed with pt, as pt refused to consent to family contact. SPI pamphlet provided to pt and pt was encouraged to share information with support network, ask questions, and talk about any concerns relating to SPE. Pt denies access to guns/firearms and verbalized understanding of information provided. Mobile Crisis information also provided to pt.   Smart, Hurshell Dino LCSWA  07/16/2015, 10:02 AM

## 2015-07-16 NOTE — Progress Notes (Signed)
D: Patient denies pain, SI, AH/VH at this time. Socialized with peers on dayroom. Compliant with treatment regimen/medications. Made no new complaint.  A: Offered support and encouragement as needed. Every 15 minutes check for safety maintained. Will continue to monitor patient.  R: Patient remains safe.

## 2015-07-16 NOTE — [facility] Suicide Risk Assessment (Signed)
[facility] Discharge Suicide Risk Assessment   Demographic Factors:  Male and Adolescent or young adult  Total Time spent with patient: 30 minutes  Musculoskeletal: Strength & Muscle Tone: within normal limits Gait & Station: normal Patient leans: normal  Psychiatric Specialty Exam: Physical Exam  Review of Systems  Constitutional: Negative.   HENT: Negative.   Eyes: Negative.   Cardiovascular: Negative.   Gastrointestinal: Negative.   Genitourinary: Negative.   Musculoskeletal: Negative.   Skin: Negative.   Neurological: Negative.   Endo/Heme/Allergies: Negative.   Psychiatric/Behavioral: Positive for substance abuse. The patient has insomnia.     Blood pressure 129/79, pulse 97, temperature 98.3 F (36.8 C), temperature source Oral, resp. rate 16, height  (1.88 m), weight 104.327 kg (230 lb).Body mass index is 29.52 kg/(m^2).  General Appearance: Fairly Groomed  Patent attorney::  Fair  Speech:  Clear and Coherent409  Volume:  Normal  Mood:  Euthymic  Affect:  Restricted  Thought Process:  Coherent and Goal Directed  Orientation:  Full (Time, Place, and Person)  Thought Content:  plans as he moves on  Suicidal Thoughts:  No  Homicidal Thoughts:  No  Memory:  Immediate;   Fair Recent;   Fair Remote;   Fair  Judgement:  Fair  Insight:  Present  Psychomotor Activity:  Normal  Concentration:  Fair  Recall:  [employer] of Knowledge:Fair  Language: Fair  Akathisia:  No  Handed:  Right  AIMS (if indicated):     Assets:  Housing Social Support  Sleep:  Number of Hours: 6.25  Cognition: WNL  ADL's:  Intact   Have you used any form of tobacco in the last 30 days? (Cigarettes, Smokeless Tobacco, Cigars, and/or Pipes): No  Has this patient used any form of tobacco in the last 30 days? (Cigarettes, Smokeless Tobacco, Cigars, and/or Pipes) No  Mental Status Per Nursing Assessment::   On Admission:  Self-harm behaviors  Current Mental Status by Physician: In full  contact with reality there are no active SI plans or intent. There are no active S/S of withdrawal. He states he plans to continue to use the Xanax and go to the pain clinic for the opioids. He slept better with the increased dose of Seroquel. He is working on adopting some Buddhist teachings. Still on the fence if he is going to modify his substance abuse   Loss Factors: NA  Historical Factors: Impulsivity  Risk Reduction Factors:   Sense of responsibility to family and Positive social support  Continued Clinical Symptoms:  Alcohol/Substance Abuse/Dependencies  Cognitive Features That Contribute To Risk:  Closed-mindedness, Polarized thinking and Thought constriction (tunnel vision)    Suicide Risk:  Minimal: No identifiable suicidal ideation.  Patients presenting with no risk factors but with morbid ruminations; may be classified as minimal risk based on the severity of the depressive symptoms  Principal Problem: Substance induced mood disorder Discharge Diagnoses:  Patient Active Problem List   Diagnosis Date Noted  . Depression, recurrent [F33.9] 07/12/2015  . Opioid dependence with withdrawal [F11.23]   . Polysubstance dependence including opioid type drug, continuous use, with perceptual disturbance [F11.222]   . Suicide attempt by drug ingestion [T50.902A]   . Polysubstance dependence including opioid type drug without complication, episodic abuse [F11.220] 01/08/2015  . Polysubstance dependence including opioid type drug, continuous use [F11.229] 03/18/2014  . Substance induced mood disorder [F19.94] 03/18/2014  . Opiate dependence [F11.20] 03/17/2014  . 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   Why:  Appt on this date at 10:00AM for hospital follow-up/medication management.    Contact information:   [address] [place], [place] [address] Phone: [phone] Fax: [phone]      Plan  Of Care/Follow-up recommendations:  Activity:  as tolerated Diet:  regular Follow up as above Is patient on multiple antipsychotic therapies at discharge:  No   Has Patient had three or more failed trials of antipsychotic monotherapy by history:  No  Recommended Plan for Multiple Antipsychotic Therapies: NA    Jamine Highfill A 07/16/2015, 12:26 PM

## 2015-07-16 NOTE — Progress Notes (Signed)
Recreation Therapy Notes  Date: 08.29.2016 Time: 9:30am Location: 300 [address] Group Room   Group Topic: Stress Management  Goal Area(s) Addresses:  Patient will actively participate in stress management techniques presented during session.   Behavioral Response: Engaged, Appropriate.   Intervention: Stress management techniques  Activity :  Deep Breathing and Progressive Body Scan. LRT provided instruction and demonstration on practice of Progressive Body Scan. Technique was coupled with deep breathing.   Education:  Stress Management, Discharge Planning.   Education Outcome: Acknowledges education  Clinical Observations/Feedback: Patient engaged in session, demonstrating ability to practice independently and expressing no concerns.    Nicholas [name], LRT/CTRS  Ariaunna Longsworth L 07/16/2015 1:41 PM
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
## 2017-02-27 IMAGING — CR DG CHEST 1V PORT
1 series · 1 of 1 positions shown · non-contrast
Comparison: 05/18/2015

CLINICAL DATA: Swallowed safety pin.

EXAM:
PORTABLE CHEST - 1 VIEW

[ap portable]
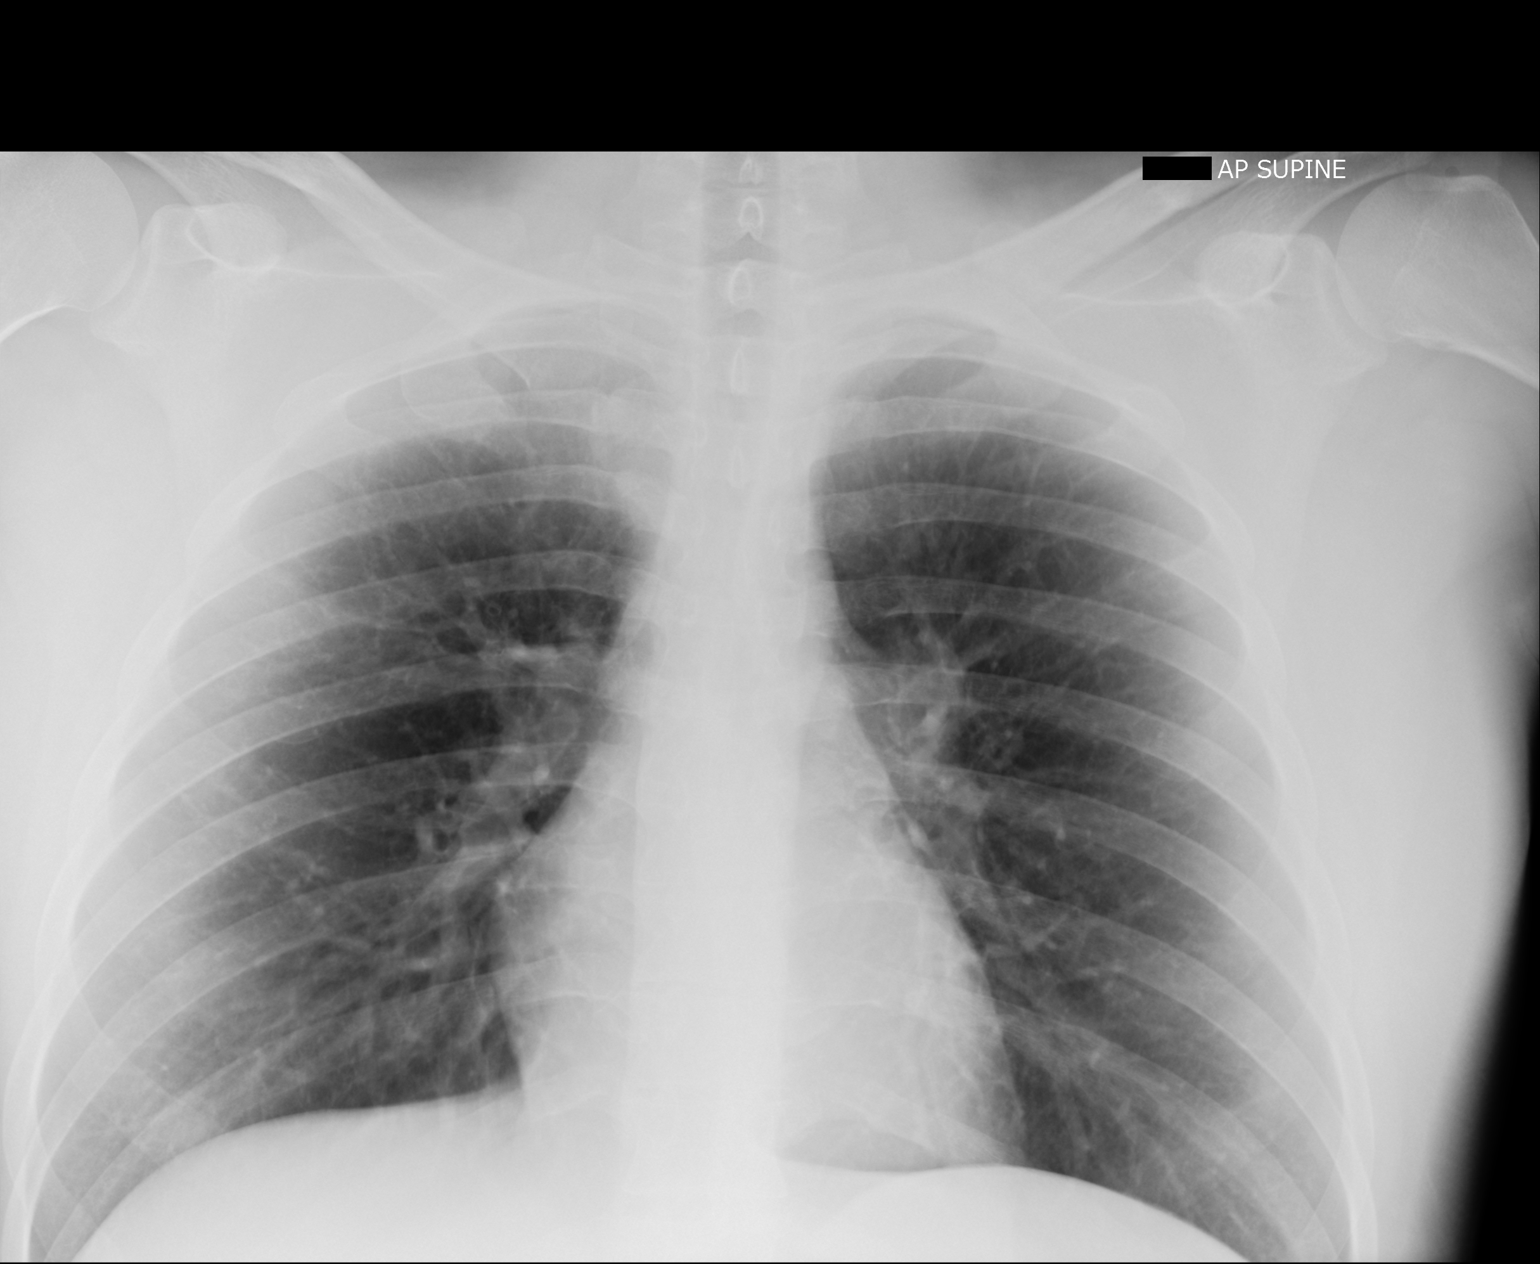

[1 of 1 positions shown; findings below may reference images not displayed]

FINDINGS: The heart size and mediastinal contours are within normal limits.
Both lungs are clear. The visualized skeletal structures are
unremarkable. No radiopaque foreign bodies identified.
IMPRESSION: No active disease.  No radiopaque foreign bodies identified.

## 2021-09-10 ENCOUNTER — Ambulatory Visit: Payer: Self-pay | Admitting: Family Medicine

## 2023-04-15 ENCOUNTER — Emergency Department (HOSPITAL_COMMUNITY): Payer: 59

## 2023-04-15 ENCOUNTER — Other Ambulatory Visit: Payer: Self-pay

## 2023-04-15 ENCOUNTER — Emergency Department (HOSPITAL_COMMUNITY)
Admission: EM | Admit: 2023-04-15 | Discharge: 2023-04-15 | Disposition: A | Discharge status: Home / Self Care | Payer: 59 | Attending: Emergency Medicine | Admitting: Emergency Medicine

## 2023-04-15 DIAGNOSIS — R0789 Other chest pain: Secondary | ICD-10-CM | POA: Diagnosis not present

## 2023-04-15 DIAGNOSIS — R072 Precordial pain: Secondary | ICD-10-CM | POA: Insufficient documentation

## 2023-04-15 DIAGNOSIS — D72829 Elevated white blood cell count, unspecified: Secondary | ICD-10-CM | POA: Diagnosis not present

## 2023-04-15 DIAGNOSIS — R079 Chest pain, unspecified: Secondary | ICD-10-CM | POA: Diagnosis not present

## 2023-04-15 LAB — BASIC METABOLIC PANEL
Anion gap: 11 (ref 5–15)
BUN: 14 mg/dL (ref 6–20)
CO2: 26 mmol/L (ref 22–32)
Calcium: 9.8 mg/dL (ref 8.9–10.3)
Chloride: 100 mmol/L (ref 98–111)
Creatinine, Ser: 1.2 mg/dL (ref 0.61–1.24)
GFR, Estimated: >60 mL/min (ref >60)
Glucose, Bld: 115 mg/dL — ABNORMAL HIGH (ref 70–99)
Potassium: 3.6 mmol/L (ref 3.5–5.1)
Sodium: 137 mmol/L (ref 135–145)

## 2023-04-15 LAB — CBC
HCT: 45.9 % (ref 39.0–52.0)
Hemoglobin: 15.6 g/dL (ref 13.0–17.0)
MCH: 30.2 pg (ref 26.0–34.0)
MCHC: 34 g/dL (ref 30.0–36.0)
MCV: 89 fL (ref 80.0–100.0)
Platelets: 383 10*3/uL (ref 150–400)
RBC: 5.16 MIL/uL (ref 4.22–5.81)
RDW: 12.2 % (ref 11.5–15.5)
WBC: 16.2 10*3/uL — ABNORMAL HIGH (ref 4.0–10.5)
nRBC: 0 % (ref 0.0–0.2)

## 2023-04-15 LAB — TROPONIN I (HIGH SENSITIVITY)
Troponin I (High Sensitivity): 14 ng/L (ref <18)
Troponin I (High Sensitivity): 13 ng/L (ref <18)

## 2023-04-15 MED ORDER — ALUM & MAG HYDROXIDE-SIMETH 200-200-20 MG/5ML PO SUSP
30.0000 mL | Freq: Once | ORAL | Status: AC
  Administered 2023-04-15: 30 mL via ORAL
  Filled 2023-04-15: qty 30

## 2023-04-15 MED ORDER — LIDOCAINE VISCOUS HCL 2 % MT SOLN
15.0000 mL | Freq: Once | OROMUCOSAL | Status: AC
  Administered 2023-04-15: 15 mL via ORAL
  Filled 2023-04-15: qty 15

## 2023-04-15 NOTE — ED Notes (Signed)
DC instructions reviewed with pt. PT verbalized understanding. PT DC °

## 2023-04-15 NOTE — ED Provider Notes (Signed)
[place] [facility] AT [facility] Provider Note  CSN: [id number] Arrival date & time: 04/15/23 4098  Chief Complaint(s) Chest Pain  HPI Nicholas [name] is a 32 y.o. male with a past medical history listed below who presents to the emergency department with 7 days of constant substernal chest pain described as achiness.  No alleviating or aggravating factors.  Patient endorsing cough without sputum.  No shortness of breath.  He endorses episode of nausea and nonbloody nonbilious episode today.  None prior.  No known fevers or chills.  No abdominal pain.  He denies any alcohol use.  Admits to daily marijuana use.  No other physical complaints.  The history is provided by the patient.    Past Medical History Past Medical History:  Diagnosis Date   Migraine    Patient Active Problem List   Diagnosis Date Noted   Depression, recurrent (HCC) 07/12/2015   Opioid dependence with withdrawal (HCC)    Polysubstance dependence including opioid type drug, continuous use, with perceptual disturbance (HCC)    Suicide attempt by drug ingestion (HCC)    Polysubstance dependence including opioid type drug without complication, episodic abuse (HCC) 01/08/2015   Polysubstance dependence including opioid type drug, continuous use (HCC) 03/18/2014   Substance induced mood disorder (HCC) 03/18/2014   Opiate dependence (HCC) 03/17/2014   Benzodiazepine dependence (HCC) 03/17/2014   Home Medication(s) Prior to Admission medications   Medication Sig Start Date End Date Taking? Authorizing Provider  ALPRAZolam Prudy Feeler) 0.5 MG tablet Take 1 tablet (0.5 mg total) by mouth 3 (three) times daily as needed for anxiety. 07/16/15   Armandina Stammer I, NP  ALPRAZolam Prudy Feeler) 1 MG tablet Take 1 tablet (1 mg total) by mouth at bedtime as needed for anxiety or sleep. 07/16/15   Armandina Stammer I, NP  oxyCODONE 10 MG TABS Take 1 tablet (10 mg total) by mouth 3 (three) times daily as needed for moderate  pain. 07/16/15   Armandina Stammer I, NP  QUEtiapine (SEROQUEL) 200 MG tablet Take 1 tablet (200 mg total) by mouth at bedtime. For mood control 07/16/15   Armandina Stammer I, NP  rOPINIRole (REQUIP) 0.25 MG tablet Take 1 tablet (0.25 mg total) by mouth at bedtime. For restless leg syndrome 07/16/15   Sanjuana Kava, NP                                                                                                                                    Allergies Patient has no known allergies.  Review of Systems Review of Systems As noted in HPI  Physical Exam Vital Signs  I have reviewed the triage vital signs BP (!) 141/91   Pulse 75   Temp 98.1 F (36.7 C) (Oral)   Resp 15   SpO2 97%   Physical Exam Vitals reviewed.  Constitutional:      General: He is not in acute distress.  Appearance: He is well-developed. He is not diaphoretic.  HENT:     Head: Normocephalic and atraumatic.     Right Ear: External ear normal.     Left Ear: External ear normal.     Nose: Nose normal.     Mouth/Throat:     Mouth: Mucous membranes are moist.  Eyes:     General: No scleral icterus.    Conjunctiva/sclera: Conjunctivae normal.  Neck:     Trachea: Phonation normal.  Cardiovascular:     Rate and Rhythm: Normal rate and regular rhythm.  Pulmonary:     Effort: Pulmonary effort is normal. No respiratory distress.     Breath sounds: No stridor.  Chest:     Chest wall: Tenderness present.    Abdominal:     General: There is no distension.  Musculoskeletal:        General: Normal range of motion.     Cervical back: Normal range of motion.  Neurological:     Mental Status: He is alert and oriented to person, place, and time.  Psychiatric:        Behavior: Behavior normal.     ED Results and Treatments Labs (all labs ordered are listed, but only abnormal results are displayed) Labs Reviewed  BASIC METABOLIC PANEL - Abnormal; Notable for the following components:      Result Value   Glucose,  Bld 115 (*)    All other components within normal limits  CBC - Abnormal; Notable for the following components:   WBC 16.2 (*)    All other components within normal limits  TROPONIN I (HIGH SENSITIVITY)  TROPONIN I (HIGH SENSITIVITY)                                                                                                                         EKG  EKG Interpretation  Date/Time:  Wednesday Apr 15 2023 06:12:40 EDT Ventricular Rate:  69 PR Interval:  128 QRS Duration: 92 QT Interval:  408 QTC Calculation: 438 R Axis:   62 Text Interpretation: Sinus rhythm No significant change was found Confirmed by Drema Pry [phone]) on 04/15/2023 6:26:56 AM       Radiology DG Chest 2 View  Result Date: 04/15/2023 CLINICAL DATA:  Chest pain. EXAM: CHEST - 2 VIEW COMPARISON:  Portable chest 07/12/2015 FINDINGS: The heart size and mediastinal contours are within normal limits. Both lungs are clear. The visualized skeletal structures are intact. Compare: Unchanged. IMPRESSION: No active cardiopulmonary disease. Electronically Signed   By: Almira Bar M.D.   On: 04/15/2023 03:37    Medications Ordered in ED Medications  alum & mag hydroxide-simeth (MAALOX/MYLANTA) 200-200-20 MG/5ML suspension 30 mL (30 mLs Oral Given 04/15/23 0609)    And  lidocaine (XYLOCAINE) 2 % viscous mouth solution 15 mL (15 mLs Oral Given 04/15/23 6045)   Procedures Procedures  (including critical care time) Medical Decision Making / ED Course  Click here for ABCD2, HEART and other calculators  Medical Decision Making Amount and/or Complexity of Data Reviewed Labs: ordered. Radiology: ordered.  Risk OTC drugs. Prescription drug management.    The patient presents to the ED for the following: Chest pain  This involves an extensive number of treatment options, and is a complaint that carries with it a high risk of complications and morbidity. The differential diagnosis includes but not limited  to:  MSK, GI related process, pericarditis, less concern for atypical ACS, doubt PE or dissection.   Work up Interpretation and Management:  Cardiac Monitoring/EKG: EKG without acute ischemic changes, dysrhythmias or blocks, or signs concerning concerning for pericarditis.  Laboratory Tests ordered listed below with my independent interpretation: CBC with leukocytosis; similar to priors. no anemia. BMP without significant electrolyte derangements or renal sufficiency. Troponin negative x2    Imaging Studies ordered listed below with my independent interpretation: Chest x-ray without evidence of pneumonia, pneumothorax, pulmonary edema pleural effusion.  ED Course: Minimal relief after GI cocktail. Mostly MSK Supportive management recommended.      Final Clinical Impression(s) / ED Diagnoses Final diagnoses:  Nonspecific chest pain   The patient appears reasonably screened and/or stabilized for discharge and I doubt any other medical condition or other [facility] requiring further screening, evaluation, or treatment in the ED at this time. I have discussed the findings, Dx and Tx plan with the patient/family who expressed understanding and agree(s) with the plan. Discharge instructions discussed at length. The patient/family was given strict return precautions who verbalized understanding of the instructions. No further questions at time of discharge.  Disposition: Discharge  Condition: Good  ED Discharge Orders     None        Follow Up: Primary care provider  Schedule an appointment as soon as possible for a visit  if you do not have a primary care physician, contact [facility] at [phone] for referral           This chart was dictated using voice recognition software.  Despite best efforts to proofread,  errors can occur which can change the documentation meaning.    Nira Conn, MD 04/15/23 1816

## 2023-04-15 NOTE — ED Triage Notes (Signed)
Patient reports persistent mild central chest pain onset last week , no SOB , denies emesis or diaphoresis .
